# Patient Record
Sex: Male | Born: 1935 | Race: Black or African American | Hispanic: No | Marital: Married | Smoking: Former smoker
Health system: Southern US, Community
[De-identification: ages and names within clinical notes are randomized; demographics above are authoritative.]

## PROBLEM LIST (undated history)

## (undated) DIAGNOSIS — I4891 Unspecified atrial fibrillation: Secondary | ICD-10-CM

## (undated) DIAGNOSIS — F0390 Unspecified dementia without behavioral disturbance: Secondary | ICD-10-CM

## (undated) DIAGNOSIS — R569 Unspecified convulsions: Secondary | ICD-10-CM

## (undated) DIAGNOSIS — E7800 Pure hypercholesterolemia, unspecified: Secondary | ICD-10-CM

## (undated) DIAGNOSIS — I10 Essential (primary) hypertension: Secondary | ICD-10-CM

## (undated) DIAGNOSIS — I639 Cerebral infarction, unspecified: Secondary | ICD-10-CM

## (undated) DIAGNOSIS — R001 Bradycardia, unspecified: Secondary | ICD-10-CM

## (undated) DIAGNOSIS — H8110 Benign paroxysmal vertigo, unspecified ear: Secondary | ICD-10-CM

## (undated) HISTORY — PX: PACEMAKER INSERTION: SHX728

---

## 2015-03-18 ENCOUNTER — Encounter: Payer: Self-pay | Admitting: Podiatry

## 2015-03-18 ENCOUNTER — Ambulatory Visit (INDEPENDENT_AMBULATORY_CARE_PROVIDER_SITE_OTHER): Payer: Medicare PPO | Admitting: Podiatry

## 2015-03-18 VITALS — BP 131/79 | HR 93 | Resp 14

## 2015-03-18 DIAGNOSIS — B351 Tinea unguium: Secondary | ICD-10-CM

## 2015-03-18 DIAGNOSIS — M79676 Pain in unspecified toe(s): Secondary | ICD-10-CM | POA: Diagnosis not present

## 2015-03-18 DIAGNOSIS — L89891 Pressure ulcer of other site, stage 1: Secondary | ICD-10-CM | POA: Diagnosis not present

## 2015-03-18 DIAGNOSIS — L600 Ingrowing nail: Secondary | ICD-10-CM | POA: Diagnosis not present

## 2015-03-18 DIAGNOSIS — L97521 Non-pressure chronic ulcer of other part of left foot limited to breakdown of skin: Secondary | ICD-10-CM

## 2015-03-18 DIAGNOSIS — M79675 Pain in left toe(s): Secondary | ICD-10-CM | POA: Diagnosis not present

## 2015-03-18 NOTE — Progress Notes (Signed)
   Subjective:    Patient ID: Edwin Wilson, male    DOB: 1930/02/19, 81 y.o.   MRN: [id number]  HPI  Patient presents her with right toe that is bleeding, draining and painful. This patient presents to the office with injured big toenail left foot.  He does not remember injuring his toe but for over a week the toe has become painful with drainage from under left big toe. There is malodor coming from under his toenail with white tissue around the borders of his left big toe. He presents for evaluation and treatment. Review of Systems  All other systems reviewed and are negative.      Objective:   Physical Exam GENERAL APPEARANCE: Alert, conversant. Appropriately groomed. No acute distress.  VASCULAR: Pedal pulses are not  palpable at  [facility] and PT bilateral.  Capillary refill time is diminished.,  Cold feet noted. NEUROLOGIC: sensation is normal to 5.07 monofilament at 5/5 sites bilateral.  Light touch is intact bilateral, Muscle strength normal.  MUSCULOSKELETAL: acceptable muscle strength, tone and stability bilateral.  Intrinsic muscluature intact bilateral.  Rectus appearance of foot and digits noted bilateral.   DERMATOLOGIC: skin color, texture, and turgor are within normal limits.  No preulcerative lesions or ulcers  are seen, no interdigital maceration noted.  No open lesions present.   No drainage noted.  NAULS  Thick disfigured discolored nails both feet.  His hallux toenail left foot is unattached from nail bed.  There is drainage and white necrotic tissue under the nail. Malodor noted.         Assessment & Plan:  Ulcer subungually left hallux.  IE  Avulsion nail plate left hallux.  Debride necrotic tissue.  Neosporin/DSD  Home instructions given.RTC  1 week

## 2015-03-27 ENCOUNTER — Ambulatory Visit: Payer: Medicare PPO | Admitting: Podiatry

## 2015-04-17 ENCOUNTER — Ambulatory Visit: Payer: Medicare PPO | Admitting: Podiatry

## 2016-04-20 ENCOUNTER — Emergency Department (HOSPITAL_BASED_OUTPATIENT_CLINIC_OR_DEPARTMENT_OTHER): Payer: Medicare Other

## 2016-04-20 ENCOUNTER — Encounter (HOSPITAL_BASED_OUTPATIENT_CLINIC_OR_DEPARTMENT_OTHER): Payer: Self-pay

## 2016-04-20 ENCOUNTER — Inpatient Hospital Stay (HOSPITAL_BASED_OUTPATIENT_CLINIC_OR_DEPARTMENT_OTHER)
Admission: EM | Admit: 2016-04-20 | Discharge: 2016-04-24 | DRG: 388 | Disposition: A | Discharge status: Home / Self Care | Payer: Medicare Other | Attending: Internal Medicine | Admitting: Internal Medicine

## 2016-04-20 DIAGNOSIS — N179 Acute kidney failure, unspecified: Secondary | ICD-10-CM | POA: Diagnosis present

## 2016-04-20 DIAGNOSIS — R0602 Shortness of breath: Secondary | ICD-10-CM

## 2016-04-20 DIAGNOSIS — Z7902 Long term (current) use of antithrombotics/antiplatelets: Secondary | ICD-10-CM

## 2016-04-20 DIAGNOSIS — D649 Anemia, unspecified: Secondary | ICD-10-CM | POA: Diagnosis not present

## 2016-04-20 DIAGNOSIS — Z950 Presence of cardiac pacemaker: Secondary | ICD-10-CM

## 2016-04-20 DIAGNOSIS — K562 Volvulus: Principal | ICD-10-CM | POA: Diagnosis present

## 2016-04-20 DIAGNOSIS — I10 Essential (primary) hypertension: Secondary | ICD-10-CM

## 2016-04-20 DIAGNOSIS — Z7982 Long term (current) use of aspirin: Secondary | ICD-10-CM

## 2016-04-20 DIAGNOSIS — E785 Hyperlipidemia, unspecified: Secondary | ICD-10-CM | POA: Diagnosis present

## 2016-04-20 DIAGNOSIS — M6281 Muscle weakness (generalized): Secondary | ICD-10-CM

## 2016-04-20 DIAGNOSIS — R140 Abdominal distension (gaseous): Secondary | ICD-10-CM

## 2016-04-20 DIAGNOSIS — Y95 Nosocomial condition: Secondary | ICD-10-CM | POA: Diagnosis present

## 2016-04-20 DIAGNOSIS — R079 Chest pain, unspecified: Secondary | ICD-10-CM

## 2016-04-20 DIAGNOSIS — R739 Hyperglycemia, unspecified: Secondary | ICD-10-CM | POA: Diagnosis present

## 2016-04-20 DIAGNOSIS — F0391 Unspecified dementia with behavioral disturbance: Secondary | ICD-10-CM | POA: Diagnosis present

## 2016-04-20 DIAGNOSIS — R569 Unspecified convulsions: Secondary | ICD-10-CM

## 2016-04-20 DIAGNOSIS — Z8673 Personal history of transient ischemic attack (TIA), and cerebral infarction without residual deficits: Secondary | ICD-10-CM

## 2016-04-20 DIAGNOSIS — Z87891 Personal history of nicotine dependence: Secondary | ICD-10-CM

## 2016-04-20 DIAGNOSIS — H8110 Benign paroxysmal vertigo, unspecified ear: Secondary | ICD-10-CM | POA: Diagnosis present

## 2016-04-20 DIAGNOSIS — J189 Pneumonia, unspecified organism: Secondary | ICD-10-CM | POA: Diagnosis present

## 2016-04-20 DIAGNOSIS — K920 Hematemesis: Secondary | ICD-10-CM | POA: Diagnosis present

## 2016-04-20 DIAGNOSIS — I4891 Unspecified atrial fibrillation: Secondary | ICD-10-CM | POA: Diagnosis present

## 2016-04-20 DIAGNOSIS — I484 Atypical atrial flutter: Secondary | ICD-10-CM | POA: Diagnosis present

## 2016-04-20 DIAGNOSIS — F03918 Unspecified dementia, unspecified severity, with other behavioral disturbance: Secondary | ICD-10-CM

## 2016-04-20 DIAGNOSIS — G40909 Epilepsy, unspecified, not intractable, without status epilepticus: Secondary | ICD-10-CM | POA: Diagnosis present

## 2016-04-20 DIAGNOSIS — Z79899 Other long term (current) drug therapy: Secondary | ICD-10-CM

## 2016-04-20 DIAGNOSIS — R1310 Dysphagia, unspecified: Secondary | ICD-10-CM | POA: Diagnosis present

## 2016-04-20 DIAGNOSIS — I119 Hypertensive heart disease without heart failure: Secondary | ICD-10-CM | POA: Diagnosis present

## 2016-04-20 DIAGNOSIS — K219 Gastro-esophageal reflux disease without esophagitis: Secondary | ICD-10-CM | POA: Diagnosis present

## 2016-04-20 HISTORY — DX: Bradycardia, unspecified: R00.1

## 2016-04-20 HISTORY — DX: Pure hypercholesterolemia, unspecified: E78.00

## 2016-04-20 HISTORY — DX: Cerebral infarction, unspecified: I63.9

## 2016-04-20 HISTORY — DX: Unspecified atrial fibrillation: I48.91

## 2016-04-20 HISTORY — DX: Unspecified convulsions: R56.9

## 2016-04-20 HISTORY — DX: Unspecified dementia, unspecified severity, without behavioral disturbance, psychotic disturbance, mood disturbance, and anxiety: F03.90

## 2016-04-20 HISTORY — DX: Essential (primary) hypertension: I10

## 2016-04-20 HISTORY — DX: Benign paroxysmal vertigo, unspecified ear: H81.10

## 2016-04-20 LAB — URINALYSIS, ROUTINE W REFLEX MICROSCOPIC
Glucose, UA: NEGATIVE mg/dL
KETONES UR: 15 mg/dL — AB
LEUKOCYTES UA: NEGATIVE
NITRITE: NEGATIVE
Specific Gravity, Urine: 1.02 (ref 1.005–1.030)
pH: 5.5 (ref 5.0–8.0)

## 2016-04-20 LAB — COMPREHENSIVE METABOLIC PANEL
ALBUMIN: 4 g/dL (ref 3.5–5.0)
ALT: 25 U/L (ref 17–63)
ANION GAP: 10 (ref 5–15)
AST: 27 U/L (ref 15–41)
Alkaline Phosphatase: 77 U/L (ref 38–126)
BILIRUBIN TOTAL: 0.6 mg/dL (ref 0.3–1.2)
BUN: 20 mg/dL (ref 6–20)
CHLORIDE: 108 mmol/L (ref 101–111)
CO2: 19 mmol/L — ABNORMAL LOW (ref 22–32)
Calcium: 10.5 mg/dL — ABNORMAL HIGH (ref 8.9–10.3)
Creatinine, Ser: 1.52 mg/dL — ABNORMAL HIGH (ref 0.61–1.24)
GFR calc Af Amer: 47 mL/min — ABNORMAL LOW (ref >60)
GFR, EST NON AFRICAN AMERICAN: 41 mL/min — AB (ref >60)
GLUCOSE: 210 mg/dL — AB (ref 65–99)
POTASSIUM: 3.9 mmol/L (ref 3.5–5.1)
Sodium: 137 mmol/L (ref 135–145)
TOTAL PROTEIN: 8.9 g/dL — AB (ref 6.5–8.1)

## 2016-04-20 LAB — CBC WITH DIFFERENTIAL/PLATELET
BASOS PCT: 0 %
Basophils Absolute: 0 10*3/uL (ref 0.0–0.1)
EOS PCT: 0 %
Eosinophils Absolute: 0 10*3/uL (ref 0.0–0.7)
HEMATOCRIT: 41.7 % (ref 39.0–52.0)
Hemoglobin: 14 g/dL (ref 13.0–17.0)
Lymphocytes Relative: 19 %
Lymphs Abs: 1.3 10*3/uL (ref 0.7–4.0)
MCH: 30.8 pg (ref 26.0–34.0)
MCHC: 33.6 g/dL (ref 30.0–36.0)
MCV: 91.9 fL (ref 78.0–100.0)
MONO ABS: 0.3 10*3/uL (ref 0.1–1.0)
MONOS PCT: 4 %
NEUTROS ABS: 5.4 10*3/uL (ref 1.7–7.7)
Neutrophils Relative %: 77 %
PLATELETS: 212 10*3/uL (ref 150–400)
RBC: 4.54 MIL/uL (ref 4.22–5.81)
RDW: 14.6 % (ref 11.5–15.5)
WBC: 7 10*3/uL (ref 4.0–10.5)

## 2016-04-20 LAB — URINE MICROSCOPIC-ADD ON

## 2016-04-20 LAB — CBG MONITORING, ED: GLUCOSE-CAPILLARY: 196 mg/dL — AB (ref 65–99)

## 2016-04-20 LAB — TROPONIN I

## 2016-04-20 MED ORDER — IPRATROPIUM-ALBUTEROL 0.5-2.5 (3) MG/3ML IN SOLN
3.0000 mL | Freq: Once | RESPIRATORY_TRACT | Status: AC
  Administered 2016-04-20: 3 mL via RESPIRATORY_TRACT
  Filled 2016-04-20: qty 3

## 2016-04-20 MED ORDER — SODIUM CHLORIDE 0.9 % IV BOLUS (SEPSIS)
500.0000 mL | Freq: Once | INTRAVENOUS | Status: AC
  Administered 2016-04-21: 500 mL via INTRAVENOUS

## 2016-04-20 MED ORDER — ALBUTEROL SULFATE (2.5 MG/3ML) 0.083% IN NEBU
2.5000 mg | INHALATION_SOLUTION | Freq: Once | RESPIRATORY_TRACT | Status: AC
  Administered 2016-04-20: 2.5 mg via RESPIRATORY_TRACT
  Filled 2016-04-20: qty 3

## 2016-04-20 NOTE — ED Notes (Signed)
EMT called RN to room due to pt "sounding wet with his breathing". RN assessed lung sounds, crackles heard. RT asked to assess, and EDP notified of change.

## 2016-04-20 NOTE — ED Provider Notes (Signed)
[facility] Provider Note   CSN: [id number] Arrival date & time: 04/20/16  2047   By signing my name below, I, Teofilo Pod, attest that this documentation has been prepared under the direction and in the presence of Rolan Bucco, MD . Electronically Signed: Teofilo Pod, ED Scribe. 04/20/2016. 9:34 PM.   History   Chief Complaint Chief Complaint  Patient presents with  . Shortness of Breath    The history is provided by the patient. No language interpreter was used.   HPI Comments:  Edwin Wilson is a 82 y.o. male with PMHx of dementia who presents to the Emergency Department complaining of constant SOB and intermittent chest pain that began this morning. Per wife, pt began breaking out in sweats, breathing heavily, and began complaining of abdominal pain earlier today. Wife states that pt has had a cough with clear sputum and did not eat much at dinner. Pt reports some mild chest pain, but denies any current SOB or pain elsewhere. Wife states that pt is currently baseline, and of his normal mental status. Pt was living at assisted living and was brought home last week. Pt has not had a BM today. Pt denies fever, vomiting, diarrhea, urinary symptoms, gain problem, and other associated complaints.    Past Medical History:  Diagnosis Date  . Dementia   . High cholesterol   . Hypertension   . Stroke [facility])     Patient Active Problem List   Diagnosis Date Noted  . Chest pain 04/21/2016    Past Surgical History:  Procedure Laterality Date  . PACEMAKER INSERTION         Home Medications    Prior to Admission medications   Medication Sig Start Date End Date Taking? Authorizing Provider  amLODipine (NORVASC) 10 MG tablet Take 10 mg by mouth. 10/26/14 04/20/16 Yes Historical Provider, MD  aspirin EC 81 MG tablet Take 81 mg by mouth. 12/30/09  Yes Historical Provider, MD  atorvastatin (LIPITOR) 80 MG tablet Take 80 mg by mouth. 10/26/14 04/20/16 Yes  Historical Provider, MD  donepezil (ARICEPT) 10 MG tablet Take 10 mg by mouth. 11/14/14  Yes Historical Provider, MD  dorzolamide-timolol (COSOPT) 22.3-6.8 MG/ML ophthalmic solution Place 1 drop into both eyes 2 (two) times daily.   Yes Historical Provider, MD  latanoprost (XALATAN) 0.005 % ophthalmic solution 1 drop at bedtime.   Yes Historical Provider, MD  levETIRAcetam (KEPPRA) 500 MG tablet Take 500 mg by mouth. 11/14/14  Yes Historical Provider, MD  lisinopril (PRINIVIL,ZESTRIL) 30 MG tablet Take 40 mg by mouth.  10/26/14 04/20/16 Yes Historical Provider, MD  Magnesium Oxide 250 MG TABS TAKE 1 TABLET BY MOUTH DAILY AS DIRECTED 05/18/14  Yes Historical Provider, MD  Multiple Vitamin (MULTI-VITAMINS) TABS Take by mouth.   Yes Historical Provider, MD  Omega-3 1000 MG CAPS Take by mouth.   Yes Historical Provider, MD  potassium chloride SA (K-DUR,KLOR-CON) 20 MEQ tablet TAKE 2 TABLETS BY MOUTH TWICE DAILY WITH MEALS 11/22/14  Yes Historical Provider, MD  ranitidine (ZANTAC) 150 MG tablet Take 150 mg by mouth. 10/26/14 04/20/16 Yes Historical Provider, MD  bimatoprost (LUMIGAN) 0.01 % SOLN Administer 1 drop to both eyes nightly.  10/29/10   Historical Provider, MD  brimonidine-timolol (COMBIGAN) 0.2-0.5 % ophthalmic solution Administer 1 drop to both eyes every twelve (12) hours. Frequency:   Dosage:0.0     Instructions:  Note: 07/29/10   Historical Provider, MD  diltiazem (CARTIA XT) 120 MG 24 hr capsule TAKE  ONE CAPSULE BY MOUTH EVERY MORNING 05/28/14   Historical Provider, MD  haloperidol (HALDOL) 5 MG tablet 1/2 to 1 at bedtime as needed 11/14/14   Historical Provider, MD  Incontinence Supply Disposable ([employer] UNDERGARMENT X-ABSORB BELT) MISC Frequency:   Dosage:0     Instructions:  Note:Use as directed for urinary incontinence. 04/26/13   Historical Provider, MD  meclizine (ANTIVERT) 12.5 MG tablet Frequency:   Dosage:0   MG  Instructions:  Note:TAKE 1 TABLET BY MOUTH THREE TIMES DAILY AS NEEDED FOR VERTIGO  04/13/13   Historical Provider, MD  THIAMINE HCL PO Take by mouth. Take as prescribed by Ophthalmologist    Historical Provider, MD    Family History No family history on file.  Social History Social History  Substance Use Topics  . Smoking status: Former Games developermoker  . Smokeless tobacco: Never Used  . Alcohol use No     Allergies   Review of patient's allergies indicates no known allergies.   Review of Systems Review of Systems  Unable to perform ROS: Dementia     Physical Exam Updated Vital Signs BP (!) 162/105   Pulse 99   Temp (!) 96.2 F (35.7 C) (Rectal) Comment: Nurse and MD notified.  Resp 18   Ht 6\' 2"  (1.88 m)   Wt 158 lb (71.7 kg)   SpO2 93%   BMI 20.29 kg/m   Physical Exam  Constitutional: He is oriented to person, place, and time. He appears well-developed and well-nourished.  HENT:  Head: Normocephalic and atraumatic.  Eyes: Pupils are equal, round, and reactive to light.  Neck: Normal range of motion. Neck supple.  Cardiovascular: Regular rhythm and normal heart sounds.   Tachycardia  Pulmonary/Chest: Effort normal and breath sounds normal. No respiratory distress. He has no wheezes. He has no rales. He exhibits no tenderness.  Abdominal: Soft. Bowel sounds are normal. There is no tenderness. There is no rebound and no guarding.  Musculoskeletal: Normal range of motion. He exhibits no edema.  Lymphadenopathy:    He has no cervical adenopathy.  Neurological: He is alert and oriented to person, place, and time.  Skin: Skin is warm and dry. No rash noted.  Psychiatric: He has a normal mood and affect.     ED Treatments / Results  DIAGNOSTIC STUDIES:  Oxygen Saturation is 100% on RA, normal by my interpretation.    COORDINATION OF CARE:  9:34 PM Discussed treatment plan with pt at bedside and pt agreed to plan.   Labs (all labs ordered are listed, but only abnormal results are displayed) Labs Reviewed  COMPREHENSIVE METABOLIC PANEL -  Abnormal; Notable for the following:       Result Value   CO2 19 (*)    Glucose, Bld 210 (*)    Creatinine, Ser 1.52 (*)    Calcium 10.5 (*)    Total Protein 8.9 (*)    GFR calc non Af Amer 41 (*)    GFR calc Af Amer 47 (*)    All other components within normal limits  URINALYSIS, ROUTINE W REFLEX MICROSCOPIC (NOT AT [facility]) - Abnormal; Notable for the following:    APPearance CLOUDY (*)    Hgb urine dipstick SMALL (*)    Bilirubin Urine SMALL (*)    Ketones, ur 15 (*)    Protein, ur >300 (*)    All other components within normal limits  URINE MICROSCOPIC-ADD ON - Abnormal; Notable for the following:    Squamous Epithelial / LPF 0-5 (*)  Bacteria, UA RARE (*)    Casts HYALINE CASTS (*)    All other components within normal limits  CBG MONITORING, ED - Abnormal; Notable for the following:    Glucose-Capillary 196 (*)    All other components within normal limits  CBC WITH DIFFERENTIAL/PLATELET  TROPONIN I  CBG MONITORING, ED    EKG  EKG Interpretation  Date/Time:  Monday April 20 2016 22:37:33 EDT Ventricular Rate:  88 PR Interval:    QRS Duration: 97 QT Interval:  386 QTC Calculation: 467 R Axis:   -64 Text Interpretation:  Atrial fibrillation Left anterior fascicular block Consider right ventricular hypertrophy Abnrm T, probable ischemia, anterolateral lds Confirmed by Maveryk Renstrom  MD, Brenda Cowher ([id number]) on 04/21/2016 12:00:53 AM       Radiology Ct Head Wo Contrast  Result Date: 04/20/2016 CLINICAL DATA:  Altered mental status. Dementia. Chest pain, cough, and shortness of breath. EXAM: CT HEAD WITHOUT CONTRAST TECHNIQUE: Contiguous axial images were obtained from the base of the skull through the vertex without intravenous contrast. COMPARISON:  None. FINDINGS: Brain: Prominent diffuse cerebral atrophy. Ventricular dilatation consistent with central atrophy. Low-attenuation changes in the deep white matter consistent small vessel ischemia. No mass effect or midline shift. No  abnormal extra-axial fluid collections. Gray-white matter junctions are distinct. Basal cisterns are not effaced. No evidence of acute intracranial hemorrhage. Vascular: Atherosclerotic vascular calcifications are present. Skull: Normal. Negative for fracture or focal lesion. Sinuses/Orbits: There is an air-fluid level in the right maxillary antrum. This may indicate sinusitis. Paranasal sinuses and mastoid air cells are otherwise clear. Other: None. IMPRESSION: No acute intracranial abnormalities. Prominent cerebral atrophy and small vessel ischemic changes. Air-fluid level in the right maxillary antrum may indicate sinus infection. Electronically Signed   By: Burman Nieves M.D.   On: 04/20/2016 23:06   Dg Chest Portable 1 View  Result Date: 04/20/2016 CLINICAL DATA:  Dyspnea since this evening. History of dementia, infarct, hypertension and pacemaker all. EXAM: PORTABLE CHEST 1 VIEW COMPARISON:  12/25/2015. FINDINGS: Cardiomegaly with tortuous ectatic appearing thoracic aorta. Right ventricular pacing wire is noted with left-sided pacemaker apparatus. Skin fold artifact along the periphery of the right hemithorax. No pneumonic consolidation, CHF, pneumothorax nor effusion. Moderate colonic distention below the diaphragm up to 8 cm possibly representing pseudo-obstruction. No suspicious osseous abnormalities. IMPRESSION: No acute cardiopulmonary disease. Cardiomegaly. Skin fold artifact along the periphery of the right thorax. Electronically Signed   By: Tollie Eth M.D.   On: 04/20/2016 22:07    Procedures Procedures (including critical care time)  Medications Ordered in ED Medications  sodium chloride 0.9 % bolus 500 mL (not administered)  ipratropium-albuterol (DUONEB) 0.5-2.5 (3) MG/3ML nebulizer solution 3 mL (3 mLs Nebulization Given 04/20/16 2240)  albuterol (PROVENTIL) (2.5 MG/3ML) 0.083% nebulizer solution 2.5 mg (2.5 mg Nebulization Given 04/20/16 2240)     Initial Impression /  Assessment and Plan / ED Course  I have reviewed the triage vital signs and the nursing notes.  Pertinent labs & imaging results that were available during my care of the patient were reviewed by me and considered in my medical decision making (see chart for details).  Clinical Course    Patient has a history of dementia so his history is limited. He has been complaining of intermittent chest pain throughout the day and his wife states he's been more short of breath. He's had a cough as well. There is no evidence of pneumonia. His EKG doesn't show ischemic changes but he is in atrial  fibrillation. Initially he was tachycardic but his heart rate has improved into the 80s. His troponin is negative. I reviewed his records in care everywhere. I don't see a definite history of atrial fibrillation in the past. However it doesn't like he was on Cardizem at one point. His chest x-ray is clear. He did begin to have some slight wheezing in the ED and was given a nebulizer treatment which did improve his wheezing. His wife felt that his breathing was better. He's had several episodes where he'll become very diaphoretic in the ED. He doesn't have any focal neurologic deficits. His head CT is negative. There is no evidence of infection. He's afebrile. His urinalysis is negative for infection. He does have a mild elevation in his creatinine. On review of his records from [facility], his creatinine has been normal in the past. He was given IV fluids in the ED. We will also will interrogate his pacemaker. Given his ongoing complaints of chest pain or shortness of breath and his episodes of diaphoresis. No witnessed in the ED, I feel that he should be admitted for observation and serial troponins. I will consult the hospitalist for transfer.  I spoke with Dr. Clyde Lundborg who has accepted the pt for transfer to obs/tele Final Clinical Impressions(s) / ED Diagnoses   Final diagnoses:  Chest pain in adult  SOB  (shortness of breath)    New Prescriptions New Prescriptions   No medications on file  I personally performed the services described in this documentation, which was scribed in my presence.  The recorded information has been reviewed and considered.     Rolan Bucco, MD 04/21/16 [phone]

## 2016-04-20 NOTE — ED Triage Notes (Addendum)
Per wife pt with SOB, sweats started this pm-pt with hx of dementia-when asked if in pain pt states "no"-NAD-steady gait

## 2016-04-21 ENCOUNTER — Observation Stay (HOSPITAL_COMMUNITY): Payer: Medicare Other

## 2016-04-21 ENCOUNTER — Encounter (HOSPITAL_COMMUNITY): Payer: Self-pay | Admitting: Family Medicine

## 2016-04-21 ENCOUNTER — Encounter (HOSPITAL_COMMUNITY)
Admission: EM | Disposition: A | Discharge status: Home / Self Care | Payer: Self-pay | Source: Home / Self Care | Attending: Internal Medicine

## 2016-04-21 DIAGNOSIS — R140 Abdominal distension (gaseous): Secondary | ICD-10-CM

## 2016-04-21 DIAGNOSIS — R079 Chest pain, unspecified: Secondary | ICD-10-CM | POA: Diagnosis present

## 2016-04-21 DIAGNOSIS — R569 Unspecified convulsions: Secondary | ICD-10-CM

## 2016-04-21 DIAGNOSIS — I4892 Unspecified atrial flutter: Secondary | ICD-10-CM | POA: Diagnosis not present

## 2016-04-21 DIAGNOSIS — H8110 Benign paroxysmal vertigo, unspecified ear: Secondary | ICD-10-CM | POA: Diagnosis present

## 2016-04-21 DIAGNOSIS — K920 Hematemesis: Secondary | ICD-10-CM | POA: Diagnosis present

## 2016-04-21 DIAGNOSIS — D649 Anemia, unspecified: Secondary | ICD-10-CM | POA: Diagnosis not present

## 2016-04-21 DIAGNOSIS — G40909 Epilepsy, unspecified, not intractable, without status epilepticus: Secondary | ICD-10-CM | POA: Diagnosis present

## 2016-04-21 DIAGNOSIS — F0391 Unspecified dementia with behavioral disturbance: Secondary | ICD-10-CM

## 2016-04-21 DIAGNOSIS — Z79899 Other long term (current) drug therapy: Secondary | ICD-10-CM | POA: Diagnosis not present

## 2016-04-21 DIAGNOSIS — K562 Volvulus: Secondary | ICD-10-CM | POA: Diagnosis present

## 2016-04-21 DIAGNOSIS — E785 Hyperlipidemia, unspecified: Secondary | ICD-10-CM | POA: Diagnosis present

## 2016-04-21 DIAGNOSIS — I484 Atypical atrial flutter: Secondary | ICD-10-CM | POA: Diagnosis present

## 2016-04-21 DIAGNOSIS — R1310 Dysphagia, unspecified: Secondary | ICD-10-CM | POA: Diagnosis present

## 2016-04-21 DIAGNOSIS — J189 Pneumonia, unspecified organism: Secondary | ICD-10-CM | POA: Diagnosis present

## 2016-04-21 DIAGNOSIS — N179 Acute kidney failure, unspecified: Secondary | ICD-10-CM | POA: Diagnosis present

## 2016-04-21 DIAGNOSIS — R0602 Shortness of breath: Secondary | ICD-10-CM

## 2016-04-21 DIAGNOSIS — Z8673 Personal history of transient ischemic attack (TIA), and cerebral infarction without residual deficits: Secondary | ICD-10-CM | POA: Diagnosis not present

## 2016-04-21 DIAGNOSIS — G308 Other Alzheimer's disease: Secondary | ICD-10-CM | POA: Diagnosis not present

## 2016-04-21 DIAGNOSIS — F03918 Unspecified dementia, unspecified severity, with other behavioral disturbance: Secondary | ICD-10-CM

## 2016-04-21 DIAGNOSIS — I10 Essential (primary) hypertension: Secondary | ICD-10-CM

## 2016-04-21 DIAGNOSIS — I119 Hypertensive heart disease without heart failure: Secondary | ICD-10-CM | POA: Diagnosis present

## 2016-04-21 DIAGNOSIS — Z7982 Long term (current) use of aspirin: Secondary | ICD-10-CM | POA: Diagnosis not present

## 2016-04-21 DIAGNOSIS — Y95 Nosocomial condition: Secondary | ICD-10-CM | POA: Diagnosis present

## 2016-04-21 DIAGNOSIS — Z950 Presence of cardiac pacemaker: Secondary | ICD-10-CM | POA: Diagnosis not present

## 2016-04-21 DIAGNOSIS — Z7902 Long term (current) use of antithrombotics/antiplatelets: Secondary | ICD-10-CM | POA: Diagnosis not present

## 2016-04-21 DIAGNOSIS — Z87891 Personal history of nicotine dependence: Secondary | ICD-10-CM | POA: Diagnosis not present

## 2016-04-21 DIAGNOSIS — I4891 Unspecified atrial fibrillation: Secondary | ICD-10-CM | POA: Diagnosis present

## 2016-04-21 DIAGNOSIS — R739 Hyperglycemia, unspecified: Secondary | ICD-10-CM | POA: Diagnosis present

## 2016-04-21 DIAGNOSIS — K219 Gastro-esophageal reflux disease without esophagitis: Secondary | ICD-10-CM | POA: Diagnosis present

## 2016-04-21 HISTORY — PX: FLEXIBLE SIGMOIDOSCOPY: SHX5431

## 2016-04-21 LAB — CBC
HCT: 45.2 % (ref 39.0–52.0)
HCT: 45.5 % (ref 39.0–52.0)
HEMATOCRIT: 47.3 % (ref 39.0–52.0)
HEMOGLOBIN: 15.8 g/dL (ref 13.0–17.0)
Hemoglobin: 14.7 g/dL (ref 13.0–17.0)
Hemoglobin: 15.1 g/dL (ref 13.0–17.0)
MCH: 30.2 pg (ref 26.0–34.0)
MCH: 30.7 pg (ref 26.0–34.0)
MCH: 30.7 pg (ref 26.0–34.0)
MCHC: 32.5 g/dL (ref 30.0–36.0)
MCHC: 33.4 g/dL (ref 30.0–36.0)
MCHC: 33.2 g/dL (ref 30.0–36.0)
MCV: 92.8 fL (ref 78.0–100.0)
MCV: 91.8 fL (ref 78.0–100.0)
MCV: 92.5 fL (ref 78.0–100.0)
PLATELETS: 211 10*3/uL (ref 150–400)
Platelets: 198 10*3/uL (ref 150–400)
Platelets: 184 K/uL (ref 150–400)
RBC: 4.87 MIL/uL (ref 4.22–5.81)
RBC: 5.15 MIL/uL (ref 4.22–5.81)
RBC: 4.92 MIL/uL (ref 4.22–5.81)
RDW: 14.6 % (ref 11.5–15.5)
RDW: 14.6 % (ref 11.5–15.5)
RDW: 14.6 % (ref 11.5–15.5)
WBC: 6.4 10*3/uL (ref 4.0–10.5)
WBC: 6.6 10*3/uL (ref 4.0–10.5)
WBC: 9.3 K/uL (ref 4.0–10.5)

## 2016-04-21 LAB — TYPE AND SCREEN
ABO/RH(D): A POS
ANTIBODY SCREEN: NEGATIVE
DAT, IgG: NEGATIVE

## 2016-04-21 LAB — TROPONIN I
Troponin I: <0.03 ng/mL
Troponin I: <0.03 ng/mL (ref <0.03)

## 2016-04-21 LAB — T4, FREE: FREE T4: 1.48 ng/dL — AB (ref 0.61–1.12)

## 2016-04-21 LAB — MAGNESIUM: Magnesium: 1.5 mg/dL — ABNORMAL LOW (ref 1.7–2.4)

## 2016-04-21 LAB — PHOSPHORUS: PHOSPHORUS: 2.8 mg/dL (ref 2.5–4.6)

## 2016-04-21 LAB — GLUCOSE, CAPILLARY
GLUCOSE-CAPILLARY: 118 mg/dL — AB (ref 65–99)
Glucose-Capillary: 109 mg/dL — ABNORMAL HIGH (ref 65–99)
Glucose-Capillary: 88 mg/dL (ref 65–99)

## 2016-04-21 LAB — APTT
APTT: 33 s (ref 24–36)
aPTT: 33 s (ref 24–36)

## 2016-04-21 LAB — LACTIC ACID, PLASMA: Lactic Acid, Venous: 3.6 mmol/L (ref 0.5–1.9)

## 2016-04-21 LAB — BRAIN NATRIURETIC PEPTIDE: B Natriuretic Peptide: 240.9 pg/mL — ABNORMAL HIGH (ref 0.0–100.0)

## 2016-04-21 LAB — PROTIME-INR
INR: 1.28
INR: 1.34
Prothrombin Time: 16.1 seconds — ABNORMAL HIGH (ref 11.4–15.2)
Prothrombin Time: 16.7 s — ABNORMAL HIGH (ref 11.4–15.2)

## 2016-04-21 LAB — TSH: TSH: 1.491 u[IU]/mL (ref 0.350–4.500)

## 2016-04-21 SURGERY — SIGMOIDOSCOPY, FLEXIBLE
Anesthesia: Moderate Sedation

## 2016-04-21 MED ORDER — SODIUM CHLORIDE 0.9% FLUSH: 3.0000 mL | Freq: Two times a day (BID) | INTRAVENOUS | Status: DC

## 2016-04-21 MED ORDER — MIDAZOLAM HCL 10 MG/2ML IJ SOLN
INTRAMUSCULAR | Status: DC | PRN
  Administered 2016-04-21: 1 mg via INTRAVENOUS

## 2016-04-21 MED ORDER — ATORVASTATIN CALCIUM 80 MG PO TABS
80.0000 mg | ORAL_TABLET | Freq: Every day | ORAL | Status: DC
  Administered 2016-04-22 – 2016-04-24 (×3): 80 mg via ORAL
  Filled 2016-04-21 (×3): qty 1

## 2016-04-21 MED ORDER — DORZOLAMIDE HCL-TIMOLOL MAL 2-0.5 % OP SOLN: 1.0000 [drp] | Freq: Two times a day (BID) | OPHTHALMIC | Status: DC

## 2016-04-21 MED ORDER — FENTANYL CITRATE (PF) 100 MCG/2ML IJ SOLN
INTRAMUSCULAR | Status: DC | PRN
  Administered 2016-04-21: 25 ug via INTRAVENOUS

## 2016-04-21 MED ORDER — HEPARIN SODIUM (PORCINE) 5000 UNIT/ML IJ SOLN: 5000.0000 [IU] | Freq: Three times a day (TID) | INTRAMUSCULAR | Status: DC

## 2016-04-21 MED ORDER — CEFEPIME HCL 1 G IJ SOLR
1.0000 g | INTRAMUSCULAR | Status: DC
  Administered 2016-04-21 – 2016-04-22 (×2): 1 g via INTRAVENOUS
  Filled 2016-04-21 (×3): qty 1

## 2016-04-21 MED ORDER — LISINOPRIL 40 MG PO TABS
40.0000 mg | ORAL_TABLET | Freq: Every day | ORAL | Status: DC
  Administered 2016-04-22 – 2016-04-24 (×3): 40 mg via ORAL
  Filled 2016-04-21 (×3): qty 1

## 2016-04-21 MED ORDER — HALOPERIDOL 5 MG PO TABS
5.0000 mg | ORAL_TABLET | Freq: Every evening | ORAL | Status: DC | PRN
  Administered 2016-04-21: 5 mg via ORAL
  Filled 2016-04-21 (×3): qty 1

## 2016-04-21 MED ORDER — MORPHINE SULFATE (PF) 2 MG/ML IV SOLN: 2.0000 mg | INTRAVENOUS | Status: DC | PRN

## 2016-04-21 MED ORDER — MIDAZOLAM HCL 5 MG/ML IJ SOLN
INTRAMUSCULAR | Status: AC
  Filled 2016-04-21: qty 1

## 2016-04-21 MED ORDER — MECLIZINE HCL 12.5 MG PO TABS: 12.5000 mg | ORAL_TABLET | Freq: Two times a day (BID) | ORAL | Status: DC | PRN

## 2016-04-21 MED ORDER — IOPAMIDOL (ISOVUE-370) INJECTION 76%
INTRAVENOUS | Status: AC
  Administered 2016-04-21: 80 mL
  Filled 2016-04-21: qty 100

## 2016-04-21 MED ORDER — SODIUM CHLORIDE 0.9 % IV SOLN
1250.0000 mg | Freq: Once | INTRAVENOUS | Status: AC
  Administered 2016-04-21: 1250 mg via INTRAVENOUS
  Filled 2016-04-21: qty 1250

## 2016-04-21 MED ORDER — BRIMONIDINE TARTRATE-TIMOLOL 0.2-0.5 % OP SOLN
1.0000 [drp] | Freq: Two times a day (BID) | OPHTHALMIC | Status: DC
  Filled 2016-04-21: qty 10

## 2016-04-21 MED ORDER — VANCOMYCIN HCL IN DEXTROSE 750-5 MG/150ML-% IV SOLN
750.0000 mg | Freq: Two times a day (BID) | INTRAVENOUS | Status: DC
  Administered 2016-04-22 – 2016-04-23 (×3): 750 mg via INTRAVENOUS
  Filled 2016-04-21 (×5): qty 150

## 2016-04-21 MED ORDER — INSULIN ASPART 100 UNIT/ML ~~LOC~~ SOLN: 0.0000 [IU] | Freq: Three times a day (TID) | SUBCUTANEOUS | Status: DC

## 2016-04-21 MED ORDER — CLOPIDOGREL BISULFATE 75 MG PO TABS
75.0000 mg | ORAL_TABLET | Freq: Every day | ORAL | Status: DC
  Administered 2016-04-22: 75 mg via ORAL
  Filled 2016-04-21 (×2): qty 1

## 2016-04-21 MED ORDER — NITROGLYCERIN 0.4 MG SL SUBL: 0.4000 mg | SUBLINGUAL_TABLET | SUBLINGUAL | Status: DC | PRN

## 2016-04-21 MED ORDER — ASPIRIN EC 81 MG PO TBEC
81.0000 mg | DELAYED_RELEASE_TABLET | Freq: Every day | ORAL | Status: DC
  Administered 2016-04-22 – 2016-04-23 (×2): 81 mg via ORAL
  Filled 2016-04-21 (×2): qty 1

## 2016-04-21 MED ORDER — CEFEPIME HCL 1 G IJ SOLR
1.0000 g | Freq: Three times a day (TID) | INTRAMUSCULAR | Status: DC
  Filled 2016-04-21 (×2): qty 1

## 2016-04-21 MED ORDER — LEVETIRACETAM 500 MG PO TABS
500.0000 mg | ORAL_TABLET | Freq: Two times a day (BID) | ORAL | Status: DC
  Administered 2016-04-21 – 2016-04-23 (×4): 500 mg via ORAL
  Filled 2016-04-21 (×4): qty 1

## 2016-04-21 MED ORDER — ALPRAZOLAM 0.25 MG PO TABS
0.2500 mg | ORAL_TABLET | Freq: Two times a day (BID) | ORAL | Status: DC | PRN
  Administered 2016-04-22 (×2): 0.25 mg via ORAL
  Filled 2016-04-21 (×2): qty 1

## 2016-04-21 MED ORDER — ACETAMINOPHEN 650 MG RE SUPP: 650.0000 mg | Freq: Four times a day (QID) | RECTAL | Status: DC | PRN

## 2016-04-21 MED ORDER — LATANOPROST 0.005 % OP SOLN: 1.0000 [drp] | Freq: Every day | OPHTHALMIC | Status: DC

## 2016-04-21 MED ORDER — GI COCKTAIL ~~LOC~~: 30.0000 mL | Freq: Four times a day (QID) | ORAL | Status: DC | PRN

## 2016-04-21 MED ORDER — HEPARIN (PORCINE) IN NACL 100-0.45 UNIT/ML-% IJ SOLN: 900.0000 [IU]/h | INTRAMUSCULAR | Status: DC

## 2016-04-21 MED ORDER — SODIUM CHLORIDE 0.9 % IV SOLN: INTRAVENOUS | Status: DC

## 2016-04-21 MED ORDER — ONDANSETRON HCL 4 MG/2ML IJ SOLN: 4.0000 mg | Freq: Four times a day (QID) | INTRAMUSCULAR | Status: DC | PRN

## 2016-04-21 MED ORDER — DORZOLAMIDE HCL-TIMOLOL MAL 2-0.5 % OP SOLN
1.0000 [drp] | Freq: Every day | OPHTHALMIC | Status: DC
  Filled 2016-04-21: qty 10

## 2016-04-21 MED ORDER — DILTIAZEM HCL ER COATED BEADS 120 MG PO CP24: 120.0000 mg | ORAL_CAPSULE | Freq: Every morning | ORAL | Status: DC

## 2016-04-21 MED ORDER — ACETAMINOPHEN 325 MG PO TABS: 650.0000 mg | ORAL_TABLET | Freq: Four times a day (QID) | ORAL | Status: DC | PRN

## 2016-04-21 MED ORDER — SODIUM CHLORIDE 0.9 % IV SOLN
INTRAVENOUS | Status: DC
  Administered 2016-04-21 – 2016-04-23 (×3): via INTRAVENOUS

## 2016-04-21 MED ORDER — TIMOLOL MALEATE 0.5 % OP SOLN
1.0000 [drp] | Freq: Two times a day (BID) | OPHTHALMIC | Status: DC
  Administered 2016-04-21 – 2016-04-24 (×5): 1 [drp] via OPHTHALMIC
  Filled 2016-04-21: qty 5

## 2016-04-21 MED ORDER — BIMATOPROST 0.01 % OP SOLN
1.0000 [drp] | Freq: Every day | OPHTHALMIC | Status: DC
  Administered 2016-04-21 – 2016-04-23 (×3): 1 [drp] via OPHTHALMIC
  Filled 2016-04-21 (×2): qty 2.5

## 2016-04-21 MED ORDER — DORZOLAMIDE HCL-TIMOLOL MAL 2-0.5 % OP SOLN
1.0000 [drp] | Freq: Two times a day (BID) | OPHTHALMIC | Status: DC
  Administered 2016-04-21 – 2016-04-24 (×4): 1 [drp] via OPHTHALMIC
  Filled 2016-04-21 (×2): qty 10

## 2016-04-21 MED ORDER — SODIUM CHLORIDE 0.9 % IV SOLN
8.0000 mg/h | INTRAVENOUS | Status: DC
  Administered 2016-04-21 – 2016-04-22 (×3): 8 mg/h via INTRAVENOUS
  Filled 2016-04-21 (×7): qty 80

## 2016-04-21 MED ORDER — ASPIRIN 325 MG PO TABS
325.0000 mg | ORAL_TABLET | Freq: Every day | ORAL | Status: DC
  Filled 2016-04-21: qty 1

## 2016-04-21 MED ORDER — INSULIN ASPART 100 UNIT/ML ~~LOC~~ SOLN: 0.0000 [IU] | Freq: Every day | SUBCUTANEOUS | Status: DC

## 2016-04-21 MED ORDER — BRIMONIDINE TARTRATE-TIMOLOL 0.2-0.5 % OP SOLN: 1.0000 [drp] | Freq: Two times a day (BID) | OPHTHALMIC | Status: DC

## 2016-04-21 MED ORDER — POTASSIUM CHLORIDE CRYS ER 20 MEQ PO TBCR
40.0000 meq | EXTENDED_RELEASE_TABLET | Freq: Two times a day (BID) | ORAL | Status: DC
  Administered 2016-04-22 – 2016-04-24 (×5): 40 meq via ORAL
  Filled 2016-04-21 (×5): qty 2

## 2016-04-21 MED ORDER — AMLODIPINE BESYLATE 10 MG PO TABS
10.0000 mg | ORAL_TABLET | Freq: Every day | ORAL | Status: DC
  Administered 2016-04-22 – 2016-04-24 (×3): 10 mg via ORAL
  Filled 2016-04-21 (×3): qty 1

## 2016-04-21 MED ORDER — ALBUTEROL SULFATE (2.5 MG/3ML) 0.083% IN NEBU: 2.5000 mg | INHALATION_SOLUTION | RESPIRATORY_TRACT | Status: DC | PRN

## 2016-04-21 MED ORDER — FENTANYL CITRATE (PF) 100 MCG/2ML IJ SOLN
INTRAMUSCULAR | Status: AC
  Filled 2016-04-21: qty 2

## 2016-04-21 MED ORDER — LATANOPROST 0.005 % OP SOLN
1.0000 [drp] | Freq: Every day | OPHTHALMIC | Status: DC
  Administered 2016-04-21 – 2016-04-22 (×2): 1 [drp] via OPHTHALMIC
  Filled 2016-04-21 (×3): qty 2.5

## 2016-04-21 MED ORDER — BRIMONIDINE TARTRATE 0.2 % OP SOLN
1.0000 [drp] | Freq: Two times a day (BID) | OPHTHALMIC | Status: DC
  Administered 2016-04-21 – 2016-04-24 (×5): 1 [drp] via OPHTHALMIC
  Filled 2016-04-21 (×2): qty 5

## 2016-04-21 MED ORDER — FAMOTIDINE 20 MG PO TABS: 20.0000 mg | ORAL_TABLET | Freq: Every day | ORAL | Status: DC

## 2016-04-21 MED ORDER — SODIUM CHLORIDE 0.9% FLUSH: 3.0000 mL | INTRAVENOUS | Status: DC | PRN

## 2016-04-21 MED ORDER — ONDANSETRON HCL 4 MG PO TABS: 4.0000 mg | ORAL_TABLET | Freq: Four times a day (QID) | ORAL | Status: DC | PRN

## 2016-04-21 MED ORDER — SODIUM CHLORIDE 0.9 % IV SOLN: 250.0000 mL | INTRAVENOUS | Status: DC | PRN

## 2016-04-21 MED ORDER — DONEPEZIL HCL 10 MG PO TABS
10.0000 mg | ORAL_TABLET | Freq: Every day | ORAL | Status: DC
  Administered 2016-04-21 – 2016-04-23 (×3): 10 mg via ORAL
  Filled 2016-04-21 (×3): qty 1

## 2016-04-21 NOTE — Consult Note (Signed)
[facility] Surgery Consult Note  Edwin Wilson [birth year]  [id number].    Requesting MD: Linna Darner Chief Complaint/Reason for Consult: sigmoid volvulus  HPI:  Edwin Wilson is an 82yo male with a history of stroke, pacemaker placement, HTN, HLD, and dementia who presented to [facility] 04/20/16 with altered mental status, shortness of breath and chest pain. Patient sleeping therefore history obtained from patient's wife. States that about 1 week ago she removed him from an assisted living facility to return home to live with her. They were concerned that with his degree of dementia she would not be able to care for him at home alone. States that he had not been feeling like himself for about 2 days. Because of his dementia she states that it is always hard to know exactly what is wrong. He had a productive cough, diaphoresis, abdominal pain, and decreased appetite.  No known fevers. Denies emesis, diarrhea, and dysuria. States that he had not had a BM in 2 days.  CT scan showed bilateral PNA, probable sigmoid volvulus with massive dilatation of most of the colon. GI was consulted performed a sigmoidoscopy for decompression earlier this afternoon.  Denies h/o prior abdominal surgery Anticoagulant use includes Plavix, ASA 37m  ROS: All systems reviewed and otherwise negative except for as above  Family History  Problem Relation Age of Onset  . Family history unknown: Yes    Past Medical History:  Diagnosis Date  . Atrial fibrillation ([place]   . BPV (benign positional vertigo)   . Bradycardia   . Dementia   . High cholesterol   . Hypertension   . Seizures ([place]   . Stroke ([facility]     Past Surgical History:  Procedure Laterality Date  . PACEMAKER INSERTION      Social History:  reports that he has quit smoking. He has never used smokeless tobacco. He reports that he does not drink alcohol or use drugs.  Allergies: No Known Allergies  Medications Prior to  Admission  Medication Sig Dispense Refill  . amLODipine (NORVASC) 10 MG tablet Take 10 mg by mouth daily.    .Marland Kitchenaspirin EC 81 MG tablet Take 81 mg by mouth.    .Marland Kitchenatorvastatin (LIPITOR) 80 MG tablet Take 80 mg by mouth daily.    . clopidogrel (PLAVIX) 75 MG tablet Take 75 mg by mouth daily.    .Marland Kitchendonepezil (ARICEPT) 10 MG tablet Take 10 mg by mouth at bedtime.     . dorzolamide-timolol (COSOPT) 22.3-6.8 MG/ML ophthalmic solution Place 1 drop into both eyes 2 (two) times daily.    .Marland Kitchenlatanoprost (XALATAN) 0.005 % ophthalmic solution Place 1 drop into both eyes at bedtime.     . levETIRAcetam (KEPPRA) 500 MG tablet Take 500 mg by mouth.    .Marland Kitchenlisinopril (PRINIVIL,ZESTRIL) 40 MG tablet Take 40 mg by mouth daily.    .Marland Kitchenloratadine (CLARITIN) 10 MG tablet Take 10 mg by mouth daily.    . Magnesium Oxide 250 MG TABS TAKE 1 TABLET BY MOUTH DAILY AS DIRECTED    . Multiple Vitamin (MULTI-VITAMINS) TABS Take 1 tablet by mouth daily.     . Omega-3 1000 MG CAPS Take 2 capsules by mouth daily.     . potassium chloride SA (K-DUR,KLOR-CON) 20 MEQ tablet TAKE 2 TABLETS BY MOUTH TWICE DAILY WITH MEALS    . ranitidine (ZANTAC) 150 MG tablet Take 150 mg by mouth.    . ranitidine (ZANTAC) 150 MG tablet Take 150 mg by mouth 2 (  two) times daily.    . bimatoprost (LUMIGAN) 0.01 % SOLN Administer 1 drop to both eyes nightly.     . brimonidine-timolol (COMBIGAN) 0.2-0.5 % ophthalmic solution Administer 1 drop to both eyes every twelve (12) hours. Frequency:   Dosage:0.0     Instructions:  Note:    . diltiazem (CARTIA XT) 120 MG 24 hr capsule TAKE ONE CAPSULE BY MOUTH EVERY MORNING    . haloperidol (HALDOL) 5 MG tablet 1/2 to 1 at bedtime as needed    . Incontinence Supply Disposable ([employer] UNDERGARMENT X-ABSORB BELT) MISC Frequency:   Dosage:0     Instructions:  Note:Use as directed for urinary incontinence.    . meclizine (ANTIVERT) 12.5 MG tablet Frequency:   Dosage:0   MG  Instructions:  Note:TAKE 1 TABLET BY MOUTH THREE  TIMES DAILY AS NEEDED FOR VERTIGO    . THIAMINE HCL PO Take 1 tablet by mouth daily. Take as prescribed by Ophthalmologist       Blood pressure (!) 203/93, pulse 74, temperature 97.4 F (36.3 C), temperature source Oral, resp. rate (!) 21, height 6' 2" (1.88 m), weight 205 lb 7.5 oz (93.2 kg), SpO2 96 %. Physical Exam:  General: sleeping, frail AA male who is laying in bed in NAD HEENT: head is normocephalic, atraumatic. Mouth is pink and moist Heart: irregularly irregular rhythm. Palpable pedal pulses bilaterally Lungs: Respiratory effort nonlabored Abd: soft, NT/ND, present but hypoactive BS, no masses, hernias, or organomegaly MS: all 4 extremities are symmetrical with no cyanosis, clubbing, or edema. Skin: warm and dry with no masses, lesions, or rashes   Results for orders placed or performed during the hospital encounter of 04/20/16 (from the past 48 hour(s))  CBG monitoring, ED     Status: Abnormal   Collection Time: 04/20/16  9:03 PM  Result Value Ref Range   Glucose-Capillary 196 (H) 65 - 99 mg/dL  CBC with Differential     Status: None   Collection Time: 04/20/16  9:10 PM  Result Value Ref Range   WBC 7.0 4.0 - 10.5 K/uL   RBC 4.54 4.22 - 5.81 MIL/uL   Hemoglobin 14.0 13.0 - 17.0 g/dL   HCT 41.7 39.0 - 52.0 %   MCV 91.9 78.0 - 100.0 fL   MCH 30.8 26.0 - 34.0 pg   MCHC 33.6 30.0 - 36.0 g/dL   RDW 14.6 11.5 - 15.5 %   Platelets 212 150 - 400 K/uL   Neutrophils Relative % 77 %   Neutro Abs 5.4 1.7 - 7.7 K/uL   Lymphocytes Relative 19 %   Lymphs Abs 1.3 0.7 - 4.0 K/uL   Monocytes Relative 4 %   Monocytes Absolute 0.3 0.1 - 1.0 K/uL   Eosinophils Relative 0 %   Eosinophils Absolute 0.0 0.0 - 0.7 K/uL   Basophils Relative 0 %   Basophils Absolute 0.0 0.0 - 0.1 K/uL  Comprehensive metabolic panel     Status: Abnormal   Collection Time: 04/20/16  9:10 PM  Result Value Ref Range   Sodium 137 135 - 145 mmol/L   Potassium 3.9 3.5 - 5.1 mmol/L   Chloride 108 101 - 111  mmol/L   CO2 19 (L) 22 - 32 mmol/L   Glucose, Bld 210 (H) 65 - 99 mg/dL   BUN 20 6 - 20 mg/dL   Creatinine, Ser 1.52 (H) 0.61 - 1.24 mg/dL   Calcium 10.5 (H) 8.9 - 10.3 mg/dL   Total Protein 8.9 (H) 6.5 - 8.1 g/dL  Albumin 4.0 3.5 - 5.0 g/dL   AST 27 15 - 41 U/L   ALT 25 17 - 63 U/L   Alkaline Phosphatase 77 38 - 126 U/L   Total Bilirubin 0.6 0.3 - 1.2 mg/dL   GFR calc non Af Amer 41 (L) >60 mL/min   GFR calc Af Amer 47 (L) >60 mL/min    Comment: (NOTE) The eGFR has been calculated using the CKD EPI equation. This calculation has not been validated in all clinical situations. eGFR's persistently <60 mL/min signify possible Chronic Kidney Disease.    Anion gap 10 5 - 15  Troponin I     Status: None   Collection Time: 04/20/16  9:10 PM  Result Value Ref Range   Troponin I <0.03 <0.03 ng/mL  Urinalysis, Routine w reflex microscopic     Status: Abnormal   Collection Time: 04/20/16 11:02 PM  Result Value Ref Range   Color, Urine YELLOW YELLOW   APPearance CLOUDY (A) CLEAR   Specific Gravity, Urine 1.020 1.005 - 1.030   pH 5.5 5.0 - 8.0   Glucose, UA NEGATIVE NEGATIVE mg/dL   Hgb urine dipstick SMALL (A) NEGATIVE   Bilirubin Urine SMALL (A) NEGATIVE   Ketones, ur 15 (A) NEGATIVE mg/dL   Protein, ur >300 (A) NEGATIVE mg/dL   Nitrite NEGATIVE NEGATIVE   Leukocytes, UA NEGATIVE NEGATIVE  Urine microscopic-add on     Status: Abnormal   Collection Time: 04/20/16 11:02 PM  Result Value Ref Range   Squamous Epithelial / LPF 0-5 (A) NONE SEEN   WBC, UA 0-5 0 - 5 WBC/hpf   RBC / HPF 6-30 0 - 5 RBC/hpf   Bacteria, UA RARE (A) NONE SEEN   Casts HYALINE CASTS (A) NEGATIVE    Comment: GRANULAR CAST RED CELL CAST   Type and screen [facility] Deputy     Status: None   Collection Time: 04/21/16  4:25 AM  Result Value Ref Range   ABO/RH(D) A POS    Antibody Screen NEG    Sample Expiration 04/24/2016    Antibody Identification NON SPECIFIC COLD ANTIBODY    DAT, IgG NEG    Troponin I (q 6hr x 3)     Status: None   Collection Time: 04/21/16  4:33 AM  Result Value Ref Range   Troponin I <0.03 <0.03 ng/mL  CBC     Status: None   Collection Time: 04/21/16  4:33 AM  Result Value Ref Range   WBC 6.4 4.0 - 10.5 K/uL   RBC 4.87 4.22 - 5.81 MIL/uL   Hemoglobin 14.7 13.0 - 17.0 g/dL   HCT 45.2 39.0 - 52.0 %   MCV 92.8 78.0 - 100.0 fL   MCH 30.2 26.0 - 34.0 pg   MCHC 32.5 30.0 - 36.0 g/dL   RDW 14.6 11.5 - 15.5 %   Platelets 211 150 - 400 K/uL  Protime-INR     Status: Abnormal   Collection Time: 04/21/16  4:33 AM  Result Value Ref Range   Prothrombin Time 16.1 (H) 11.4 - 15.2 seconds   INR 1.28   APTT     Status: None   Collection Time: 04/21/16  4:33 AM  Result Value Ref Range   aPTT 33 24 - 36 seconds  Troponin I (q 6hr x 3)     Status: None   Collection Time: 04/21/16  9:10 AM  Result Value Ref Range   Troponin I <0.03 <0.03 ng/mL  CBC  Status: None   Collection Time: 04/21/16  9:10 AM  Result Value Ref Range   WBC 6.6 4.0 - 10.5 K/uL   RBC 5.15 4.22 - 5.81 MIL/uL   Hemoglobin 15.8 13.0 - 17.0 g/dL   HCT 47.3 39.0 - 52.0 %   MCV 91.8 78.0 - 100.0 fL   MCH 30.7 26.0 - 34.0 pg   MCHC 33.4 30.0 - 36.0 g/dL   RDW 14.6 11.5 - 15.5 %   Platelets 198 150 - 400 K/uL  Glucose, capillary     Status: Abnormal   Collection Time: 04/21/16  1:25 PM  Result Value Ref Range   Glucose-Capillary 109 (H) 65 - 99 mg/dL   Comment 1 Notify RN    Comment 2 Document in Chart   Troponin I (q 6hr x 3)     Status: None   Collection Time: 04/21/16  2:20 PM  Result Value Ref Range   Troponin I <0.03 <0.03 ng/mL  CBC     Status: None   Collection Time: 04/21/16  2:20 PM  Result Value Ref Range   WBC 9.3 4.0 - 10.5 K/uL   RBC 4.92 4.22 - 5.81 MIL/uL   Hemoglobin 15.1 13.0 - 17.0 g/dL   HCT 45.5 39.0 - 52.0 %   MCV 92.5 78.0 - 100.0 fL   MCH 30.7 26.0 - 34.0 pg   MCHC 33.2 30.0 - 36.0 g/dL   RDW 14.6 11.5 - 15.5 %   Platelets 184 150 - 400 K/uL  APTT      Status: None   Collection Time: 04/21/16  2:20 PM  Result Value Ref Range   aPTT 33 24 - 36 seconds  Protime-INR     Status: Abnormal   Collection Time: 04/21/16  2:20 PM  Result Value Ref Range   Prothrombin Time 16.7 (H) 11.4 - 15.2 seconds   INR 1.34    Ct Head Wo Contrast  Result Date: 04/20/2016 CLINICAL DATA:  Altered mental status. Dementia. Chest pain, cough, and shortness of breath. EXAM: CT HEAD WITHOUT CONTRAST TECHNIQUE: Contiguous axial images were obtained from the base of the skull through the vertex without intravenous contrast. COMPARISON:  None. FINDINGS: Brain: Prominent diffuse cerebral atrophy. Ventricular dilatation consistent with central atrophy. Low-attenuation changes in the deep white matter consistent small vessel ischemia. No mass effect or midline shift. No abnormal extra-axial fluid collections. Gray-white matter junctions are distinct. Basal cisterns are not effaced. No evidence of acute intracranial hemorrhage. Vascular: Atherosclerotic vascular calcifications are present. Skull: Normal. Negative for fracture or focal lesion. Sinuses/Orbits: There is an air-fluid level in the right maxillary antrum. This may indicate sinusitis. Paranasal sinuses and mastoid air cells are otherwise clear. Other: None. IMPRESSION: No acute intracranial abnormalities. Prominent cerebral atrophy and small vessel ischemic changes. Air-fluid level in the right maxillary antrum may indicate sinus infection. Electronically Signed   By: Lucienne Capers M.D.   On: 04/20/2016 23:06   Ct Angio Chest Pe W Or Wo Contrast  Result Date: 04/21/2016 CLINICAL DATA:  Shortness of breath, chest pain and abdominal pain. EXAM: CT ANGIOGRAPHY CHEST CT ABDOMEN AND PELVIS WITH CONTRAST TECHNIQUE: Multidetector CT imaging of the chest was performed using the standard protocol during bolus administration of intravenous contrast. Multiplanar CT image reconstructions and MIPs were obtained to evaluate the  vascular anatomy. Multidetector CT imaging of the abdomen and pelvis was performed using the standard protocol during bolus administration of intravenous contrast. CONTRAST:  80 mL Isovue 370 IV COMPARISON:  Prior CT of the abdomen and pelvis at [facility] on 11/02/2012 FINDINGS: CTA CHEST FINDINGS Cardiovascular: Pulmonary arterial opacification is somewhat limited in the peripheral lungs. No pulmonary embolism is identified. The thoracic aorta demonstrates calcified plaque without aneurysmal disease. The heart size is normal. No pericardial fluid identified. Probable calcified plaque in the distribution of the left circumflex coronary artery and also potentially the right coronary artery. Mediastinum/Nodes: No evidence of mediastinal, hilar or axillary lymphadenopathy. No mediastinal masses or fluid collections. The thyroid gland appears unremarkable. Lungs/Pleura: Patchy alveolar airspace disease is noted in the right upper lobe, right middle lobe, right lower lobe and left lower lobe. The most significant involvement is in the right lower lobe. Findings are consistent with pneumonia and may reflect aspiration. No associated pulmonary edema or pleural effusions. No pneumothorax identified. There is a component of bronchial obstruction, likely reflecting mucous plug formation in the medial and posterior right lower lobe. Musculoskeletal: Spondylosis of the thoracic spine. No bony lesions or fractures identified. Review of the MIP images confirms the above findings. CT ABDOMEN and PELVIS FINDINGS Hepatobiliary: Stable hepatic cysts. The largest remains at the dome of the liver measures approximately 2.4 cm. No evidence of hepatic masses or biliary obstruction. The gallbladder is unremarkable. Pancreas: Unremarkable. No pancreatic ductal dilatation or surrounding inflammatory changes. Spleen: Normal in size without focal abnormality. Adrenals/Urinary Tract: Adrenal glands are unremarkable. Kidneys are  normal, without renal calculi, focal lesion, or hydronephrosis. Bladder is unremarkable and decompressed. Stomach/Bowel: There is a small hiatal hernia. The stomach contains a moderate amount of fluid. There is massive gaseous dilatation of much of the colon with maximal colonic diameter of approximately 11.8 cm. As the colon is followed by CT, there is a transition at the level of the sigmoid colon with decompression of the distal sigmoid and rectum. There is a swirling appearance of the proximal sigmoid and adjacent vessels within the mesentery and findings are suspicious for a sigmoid volvulus. No associated free air or pneumatosis identified. The small bowel is decompressed. No abscess is identified. Vascular/Lymphatic: No enlarged lymph nodes are seen. The abdominal aorta is calcified and without evidence of aneurysm. Reproductive: Unremarkable. Other: Small left inguinal hernia contains a short segment of nondilated small bowel. There is no evidence to suggest incarceration. Musculoskeletal: Bony structures show spondylosis of the lumbar spine. Review of the MIP images confirms the above findings. IMPRESSION: 1. Evidence of bilateral pneumonia, right greater than left. Findings may reflect aspiration. 2. Coronary and aortic atherosclerosis. No evidence of aortic aneurysm. 3. Findings by CT concerning for sigmoid volvulus with significant gaseous distention of the colon proximal to a level of relative beaking of the sigmoid colon and decompression of the sigmoid and rectum distal to the transition point. Recommend surgical and gastroenterology consultation. 4. These results will be called to the ordering clinician or representative by the Radiologist Assistant, and communication documented in the PACS or zVision Dashboard. Electronically Signed   By: Aletta Edouard M.D.   On: 04/21/2016 13:19   Ct Abdomen Pelvis W Contrast  Result Date: 04/21/2016 CLINICAL DATA:  Shortness of breath, chest pain and  abdominal pain. EXAM: CT ANGIOGRAPHY CHEST CT ABDOMEN AND PELVIS WITH CONTRAST TECHNIQUE: Multidetector CT imaging of the chest was performed using the standard protocol during bolus administration of intravenous contrast. Multiplanar CT image reconstructions and MIPs were obtained to evaluate the vascular anatomy. Multidetector CT imaging of the abdomen and pelvis was performed using the standard protocol during bolus administration of intravenous  contrast. CONTRAST:  80 mL Isovue 370 IV COMPARISON:  Prior CT of the abdomen and pelvis at [facility] on 11/02/2012 FINDINGS: CTA CHEST FINDINGS Cardiovascular: Pulmonary arterial opacification is somewhat limited in the peripheral lungs. No pulmonary embolism is identified. The thoracic aorta demonstrates calcified plaque without aneurysmal disease. The heart size is normal. No pericardial fluid identified. Probable calcified plaque in the distribution of the left circumflex coronary artery and also potentially the right coronary artery. Mediastinum/Nodes: No evidence of mediastinal, hilar or axillary lymphadenopathy. No mediastinal masses or fluid collections. The thyroid gland appears unremarkable. Lungs/Pleura: Patchy alveolar airspace disease is noted in the right upper lobe, right middle lobe, right lower lobe and left lower lobe. The most significant involvement is in the right lower lobe. Findings are consistent with pneumonia and may reflect aspiration. No associated pulmonary edema or pleural effusions. No pneumothorax identified. There is a component of bronchial obstruction, likely reflecting mucous plug formation in the medial and posterior right lower lobe. Musculoskeletal: Spondylosis of the thoracic spine. No bony lesions or fractures identified. Review of the MIP images confirms the above findings. CT ABDOMEN and PELVIS FINDINGS Hepatobiliary: Stable hepatic cysts. The largest remains at the dome of the liver measures approximately 2.4 cm. No  evidence of hepatic masses or biliary obstruction. The gallbladder is unremarkable. Pancreas: Unremarkable. No pancreatic ductal dilatation or surrounding inflammatory changes. Spleen: Normal in size without focal abnormality. Adrenals/Urinary Tract: Adrenal glands are unremarkable. Kidneys are normal, without renal calculi, focal lesion, or hydronephrosis. Bladder is unremarkable and decompressed. Stomach/Bowel: There is a small hiatal hernia. The stomach contains a moderate amount of fluid. There is massive gaseous dilatation of much of the colon with maximal colonic diameter of approximately 11.8 cm. As the colon is followed by CT, there is a transition at the level of the sigmoid colon with decompression of the distal sigmoid and rectum. There is a swirling appearance of the proximal sigmoid and adjacent vessels within the mesentery and findings are suspicious for a sigmoid volvulus. No associated free air or pneumatosis identified. The small bowel is decompressed. No abscess is identified. Vascular/Lymphatic: No enlarged lymph nodes are seen. The abdominal aorta is calcified and without evidence of aneurysm. Reproductive: Unremarkable. Other: Small left inguinal hernia contains a short segment of nondilated small bowel. There is no evidence to suggest incarceration. Musculoskeletal: Bony structures show spondylosis of the lumbar spine. Review of the MIP images confirms the above findings. IMPRESSION: 1. Evidence of bilateral pneumonia, right greater than left. Findings may reflect aspiration. 2. Coronary and aortic atherosclerosis. No evidence of aortic aneurysm. 3. Findings by CT concerning for sigmoid volvulus with significant gaseous distention of the colon proximal to a level of relative beaking of the sigmoid colon and decompression of the sigmoid and rectum distal to the transition point. Recommend surgical and gastroenterology consultation. 4. These results will be called to the ordering clinician or  representative by the Radiologist Assistant, and communication documented in the PACS or zVision Dashboard. Electronically Signed   By: Aletta Edouard M.D.   On: 04/21/2016 13:19   Dg Chest Portable 1 View  Result Date: 04/20/2016 CLINICAL DATA:  Dyspnea since this evening. History of dementia, infarct, hypertension and pacemaker all. EXAM: PORTABLE CHEST 1 VIEW COMPARISON:  12/25/2015. FINDINGS: Cardiomegaly with tortuous ectatic appearing thoracic aorta. Right ventricular pacing wire is noted with left-sided pacemaker apparatus. Skin fold artifact along the periphery of the right hemithorax. No pneumonic consolidation, CHF, pneumothorax nor effusion. Moderate colonic distention below  the diaphragm up to 8 cm possibly representing pseudo-obstruction. No suspicious osseous abnormalities. IMPRESSION: No acute cardiopulmonary disease. Cardiomegaly. Skin fold artifact along the periphery of the right thorax. Electronically Signed   By: Ashley Royalty M.D.   On: 04/20/2016 22:07      Assessment/Plan Sigmoid volvulus - CT: Findings by CT concerning for sigmoid volvulus with significant gaseous distention of the colon proximal to a level of relative beaking of the sigmoid colon and decompression of the sigmoid and rectum distal to the transition point. - s/p sigmoidoscopy for decompression this afternoon  Bilateral pneumonia - likely HCAP, started on Cef/vanc per family medicine Afib S/p pacemaker placement HTN HLD H/o stroke Dementia   Plan - s/p sigmoidoscopy for decompression this afternoon, resting comfortably now. Will discuss role of surgery with MD.  Jerrye Beavers, [facility] Surgery 04/21/2016, 3:13 PM Pager: [phone] Consults: [phone] Mon-Fri 7:00 am-4:30 pm Sat-Sun 7:00 am-11:30 am

## 2016-04-21 NOTE — Op Note (Signed)
[facility] Patient Name: Edwin Wilson Procedure Date : 04/21/2016 MRN: [id number] Attending MD: Napoleon Form , MD Date of Birth: [birth year] CSN: [id number] Age: 82 Admit Type: Inpatient Procedure:                Flexible Sigmoidoscopy Indications:              Volvulus Providers:                Napoleon Form, MD, Priscella Mann, RN,                            Clearnce Sorrel, Technician Referring MD:              Medicines:                Fentanyl 25 micrograms IV, Midazolam 1 mg IV Complications:            No immediate complications. Estimated Blood Loss:     Estimated blood loss: none. Procedure:                Pre-Anesthesia Assessment:                           - Prior to the procedure, a History and Physical                            was performed, and patient medications and                            allergies were reviewed. The patient's tolerance of                            previous anesthesia was also reviewed. The risks                            and benefits of the procedure and the sedation                            options and risks were discussed with the patient.                            All questions were answered, and informed consent                            was obtained. Prior Anticoagulants: The patient                            last took Plavix (clopidogrel) 1 day prior to the                            procedure. ASA Grade Assessment: IV - A patient                            with severe systemic disease that is a constant  threat to life. After reviewing the risks and                            benefits, the patient was deemed in satisfactory                            condition to undergo the procedure.                           After obtaining informed consent, the scope was                            passed under direct vision. The EC-[id number] ([id number]                            scope was  introduced through the anus and advanced                            to the the left transverse colon. The flexible                            sigmoidoscopy was accomplished without difficulty.                            The patient tolerated the procedure well. The                            quality of the bowel preparation was Unprepped                            colon. Scope In: 3:10:22 PM Scope Out: 3:21:29 PM Total Procedure Duration: 0 hours 11 minutes 7 seconds  Findings:      The perianal and digital rectal examinations were normal.      A volvulus, with viable appearing mucosa, was found in the distal       sigmoid colon. Decompression of the volvulus was attempted and was       successful, with complete decompression achieved. Estimated blood loss       was minimal. Scope couldnt be extended beyond distal transverse colon       due to unprepped colon with poor visualization Impression:               - Volvulus. Successful complete decompression                            achieved.                           - No specimens collected. Moderate Sedation:      Moderate (conscious) sedation was administered by the endoscopy nurse       and supervised by the endoscopist. The following parameters were       monitored: oxygen saturation, heart rate, blood pressure, and response       to care. Total physician intraservice time was 12 minutes. Recommendation:           - Use Benefiber  two teaspoons PO TID.                           - Dulcolax 1-3 tabs or caps PO daily to have 1-2                            soft BM per day.                           - Advance diet as tolerated.                           - Continue present medications.                           -Out of bed to chair and ambulate as tolerated                           - Consult surgery for possible sigmoid pexy to                            prevent recurrence                           -Will sign off, available for any  questions Procedure Code(s):        --- Professional ---                           [id number], Sigmoidoscopy, flexible; with decompression                            (for pathologic distention) (eg, volvulus,                            megacolon), including placement of decompression                            tube, when performed                           G0500, Moderate sedation services provided by the                            same physician or other qualified health care                            professional performing a gastrointestinal                            endoscopic service that sedation supports,                            requiring the presence of an independent trained                            observer to assist in the monitoring of  the                            patient's level of consciousness and physiological                            status; initial 15 minutes of intra-service time;                            patient age 77 years or older (additional time may                            be reported with 47829, as appropriate) Diagnosis Code(s):        --- Professional ---                           K56.2, Volvulus CPT copyright 2016 [employer] Association. All rights reserved. The codes documented in this report are preliminary and upon coder review may  be revised to meet current compliance requirements. Napoleon Form, MD 04/21/2016 3:40:18 PM This report has been signed electronically. Number of Addenda: 0

## 2016-04-21 NOTE — H&P (Signed)
History and Physical    Edwin Wilson ZOX:[id number] DOB: 1929-11-30 DOA: 04/20/2016  PCP: Patria Mane, MD Patient coming from: home  Chief Complaint: SOB  HPI: Edwin Wilson is a 82 y.o. male with medical history significant of dementia, HLD, HTN, CVA.   Level V caveat applies as patient presenting in altered mental state and unable to provide reliable history given his dementia and acute illness.Marland Kitchen  History provided by patient, nursing staff and EDP. Attempts were made to reach family by phone but were unavailable. No family in patient's room at time of examination. Per report patient presented to [facility] emergency room and approximately 2053 on 04/20/2016 complaining of shortness of breath, and sweating. Shortness of breath was described as constant with associated chest pain. Started in the morning, 04/20/2016. Patient also complained of abdominal pain over the course of the day intermittently. Associated with cough with clear sputum and anorexia. Mental status is at baseline. Patient living at assisted living facility until approximately 1 week ago which point time he was brought home. No other focal complaints such as lower extremity swelling, fevers, vomiting, diarrhea, dysuria, frequency, focal neurological deficit.  In the ED nursing staff reported single episode of bloody emesis though does not clear at this point time if it was emesis or hemoptysis  ED Course: After reported hematemesis patient was started on a Protonix drip and serial CBCs were ordered. Patient noted to have atrial flutter which is a new finding. Patient also noted to have AK I and low O2 saturations 70%.  Review of Systems: As per HPI otherwise 10 point review of systems negative.   Ambulatory Status:unclear given Dementia, lack of family input and acute illness.   Past Medical History:  Diagnosis Date  . Atrial fibrillation (HCC)   . BPV (benign positional vertigo)   . Bradycardia   . Dementia     . High cholesterol   . Hypertension   . Seizures (HCC)   . Stroke [facility])     Past Surgical History:  Procedure Laterality Date  . PACEMAKER INSERTION      Social History   Social History  . Marital status: Married    Spouse name: N/A  . Number of children: N/A  . Years of education: N/A   Occupational History  . Not on file.   Social History Main Topics  . Smoking status: Former Games developer  . Smokeless tobacco: Never Used  . Alcohol use No  . Drug use: No  . Sexual activity: Not on file   Other Topics Concern  . Not on file   Social History Narrative  . No narrative on file    No Known Allergies  Family History  Problem Relation Age of Onset  . Family history unknown: Yes    Prior to Admission medications   Medication Sig Start Date End Date Taking? Authorizing Provider  amLODipine (NORVASC) 10 MG tablet Take 10 mg by mouth. 10/26/14 04/20/16 Yes Historical Provider, MD  aspirin EC 81 MG tablet Take 81 mg by mouth. 12/30/09  Yes Historical Provider, MD  atorvastatin (LIPITOR) 80 MG tablet Take 80 mg by mouth. 10/26/14 04/20/16 Yes Historical Provider, MD  donepezil (ARICEPT) 10 MG tablet Take 10 mg by mouth. 11/14/14  Yes Historical Provider, MD  dorzolamide-timolol (COSOPT) 22.3-6.8 MG/ML ophthalmic solution Place 1 drop into both eyes 2 (two) times daily.   Yes Historical Provider, MD  latanoprost (XALATAN) 0.005 % ophthalmic solution 1 drop at bedtime.   Yes Historical Provider, MD  levETIRAcetam (KEPPRA) 500 MG tablet Take 500 mg by mouth. 11/14/14  Yes Historical Provider, MD  lisinopril (PRINIVIL,ZESTRIL) 30 MG tablet Take 40 mg by mouth.  10/26/14 04/20/16 Yes Historical Provider, MD  Magnesium Oxide 250 MG TABS TAKE 1 TABLET BY MOUTH DAILY AS DIRECTED 05/18/14  Yes Historical Provider, MD  Multiple Vitamin (MULTI-VITAMINS) TABS Take by mouth.   Yes Historical Provider, MD  Omega-3 1000 MG CAPS Take by mouth.   Yes Historical Provider, MD  potassium chloride SA  (K-DUR,KLOR-CON) 20 MEQ tablet TAKE 2 TABLETS BY MOUTH TWICE DAILY WITH MEALS 11/22/14  Yes Historical Provider, MD  ranitidine (ZANTAC) 150 MG tablet Take 150 mg by mouth. 10/26/14 04/20/16 Yes Historical Provider, MD  bimatoprost (LUMIGAN) 0.01 % SOLN Administer 1 drop to both eyes nightly.  10/29/10   Historical Provider, MD  brimonidine-timolol (COMBIGAN) 0.2-0.5 % ophthalmic solution Administer 1 drop to both eyes every twelve (12) hours. Frequency:   Dosage:0.0     Instructions:  Note: 07/29/10   Historical Provider, MD  diltiazem (CARTIA XT) 120 MG 24 hr capsule TAKE ONE CAPSULE BY MOUTH EVERY MORNING 05/28/14   Historical Provider, MD  haloperidol (HALDOL) 5 MG tablet 1/2 to 1 at bedtime as needed 11/14/14   Historical Provider, MD  Incontinence Supply Disposable ([employer] UNDERGARMENT X-ABSORB BELT) MISC Frequency:   Dosage:0     Instructions:  Note:Use as directed for urinary incontinence. 04/26/13   Historical Provider, MD  meclizine (ANTIVERT) 12.5 MG tablet Frequency:   Dosage:0   MG  Instructions:  Note:TAKE 1 TABLET BY MOUTH THREE TIMES DAILY AS NEEDED FOR VERTIGO 04/13/13   Historical Provider, MD  THIAMINE HCL PO Take by mouth. Take as prescribed by Ophthalmologist    Historical Provider, MD    Physical Exam: Vitals:   04/21/16 0130 04/21/16 0235 04/21/16 0341 04/21/16 0610  BP: (!) 161/102 (!) 164/91  (!) 149/96  Pulse: 102 97    Resp: 22 20    Temp:  97.4 F (36.3 C)    TempSrc:  Oral Oral   SpO2: 98% 96%    Weight:  93.2 kg (205 lb 7.5 oz)    Height:         General: ill appearing. Resting in bed.  Eyes:  PERRL, EOMI, normal lids, iris ENT: poor dentition, dry mm Neck:  no LAD, masses or thyromegaly Cardiovascular: irregularly irregular, 3/6 systolic murmur No LE edema.  Respiratory: crackles in bases w/ decreased breath sounds. nml effort.  Abdomen: distended, nonttp, hypoactive BS Skin:  no rash or induration seen on limited exam Musculoskeletal:  grossly normal tone BUE/BLE,  good ROM, no bony abnormality Psychiatric: Pleasant. Follows basic commands. Unable to provide reliable response to questions given dementia Neurologic:  CN 2-12 grossly intact, moves all extremities in coordinated fashion, sensation intact  Labs on Admission: I have personally reviewed following labs and imaging studies  CBC:  Recent Labs Lab 04/20/16 2110 04/21/16 0433 04/21/16 0910  WBC 7.0 6.4 6.6  NEUTROABS 5.4  --   --   HGB 14.0 14.7 15.8  HCT 41.7 45.2 47.3  MCV 91.9 92.8 91.8  PLT 212 211 198   Basic Metabolic Panel:  Recent Labs Lab 04/20/16 2110  NA 137  K 3.9  CL 108  CO2 19*  GLUCOSE 210*  BUN 20  CREATININE 1.52*  CALCIUM 10.5*   GFR: Estimated Creatinine Clearance: 42.8 mL/min (by C-G formula based on SCr of 1.52 mg/dL (H)). Liver Function Tests:  Recent Labs  Lab 04/20/16 2110  AST 27  ALT 25  ALKPHOS 77  BILITOT 0.6  PROT 8.9*  ALBUMIN 4.0   No results for input(s): LIPASE, AMYLASE in the last 168 hours. No results for input(s): AMMONIA in the last 168 hours. Coagulation Profile:  Recent Labs Lab 04/21/16 0433  INR 1.28   Cardiac Enzymes:  Recent Labs Lab 04/20/16 2110 04/21/16 0433 04/21/16 0910  TROPONINI <0.03 <0.03 <0.03   BNP (last 3 results) No results for input(s): PROBNP in the last 8760 hours. HbA1C: No results for input(s): HGBA1C in the last 72 hours. CBG:  Recent Labs Lab 04/20/16 2103  GLUCAP 196*   Lipid Profile: No results for input(s): CHOL, HDL, LDLCALC, TRIG, CHOLHDL, LDLDIRECT in the last 72 hours. Thyroid Function Tests: No results for input(s): TSH, T4TOTAL, FREET4, T3FREE, THYROIDAB in the last 72 hours. Anemia Panel: No results for input(s): VITAMINB12, FOLATE, FERRITIN, TIBC, IRON, RETICCTPCT in the last 72 hours. Urine analysis:    Component Value Date/Time   COLORURINE YELLOW 04/20/2016 2302   APPEARANCEUR CLOUDY (A) 04/20/2016 2302   LABSPEC 1.020 04/20/2016 2302   PHURINE 5.5  04/20/2016 2302   GLUCOSEU NEGATIVE 04/20/2016 2302   HGBUR SMALL (A) 04/20/2016 2302   BILIRUBINUR SMALL (A) 04/20/2016 2302   KETONESUR 15 (A) 04/20/2016 2302   PROTEINUR >300 (A) 04/20/2016 2302   NITRITE NEGATIVE 04/20/2016 2302   LEUKOCYTESUR NEGATIVE 04/20/2016 2302    Creatinine Clearance: Estimated Creatinine Clearance: 42.8 mL/min (by C-G formula based on SCr of 1.52 mg/dL (H)).  Sepsis Labs: @LABRCNTIP (procalcitonin:4,lacticidven:4) )No results found for this or any previous visit (from the past 240 hour(s)).   Radiological Exams on Admission: Ct Head Wo Contrast  Result Date: 04/20/2016 CLINICAL DATA:  Altered mental status. Dementia. Chest pain, cough, and shortness of breath. EXAM: CT HEAD WITHOUT CONTRAST TECHNIQUE: Contiguous axial images were obtained from the base of the skull through the vertex without intravenous contrast. COMPARISON:  None. FINDINGS: Brain: Prominent diffuse cerebral atrophy. Ventricular dilatation consistent with central atrophy. Low-attenuation changes in the deep white matter consistent small vessel ischemia. No mass effect or midline shift. No abnormal extra-axial fluid collections. Gray-white matter junctions are distinct. Basal cisterns are not effaced. No evidence of acute intracranial hemorrhage. Vascular: Atherosclerotic vascular calcifications are present. Skull: Normal. Negative for fracture or focal lesion. Sinuses/Orbits: There is an air-fluid level in the right maxillary antrum. This may indicate sinusitis. Paranasal sinuses and mastoid air cells are otherwise clear. Other: None. IMPRESSION: No acute intracranial abnormalities. Prominent cerebral atrophy and small vessel ischemic changes. Air-fluid level in the right maxillary antrum may indicate sinus infection. Electronically Signed   By: Burman Nieves M.D.   On: 04/20/2016 23:06   Dg Chest Portable 1 View  Result Date: 04/20/2016 CLINICAL DATA:  Dyspnea since this evening. History of  dementia, infarct, hypertension and pacemaker all. EXAM: PORTABLE CHEST 1 VIEW COMPARISON:  12/25/2015. FINDINGS: Cardiomegaly with tortuous ectatic appearing thoracic aorta. Right ventricular pacing wire is noted with left-sided pacemaker apparatus. Skin fold artifact along the periphery of the right hemithorax. No pneumonic consolidation, CHF, pneumothorax nor effusion. Moderate colonic distention below the diaphragm up to 8 cm possibly representing pseudo-obstruction. No suspicious osseous abnormalities. IMPRESSION: No acute cardiopulmonary disease. Cardiomegaly. Skin fold artifact along the periphery of the right thorax. Electronically Signed   By: Tollie Eth M.D.   On: 04/20/2016 22:07    EKG: Independently reviewed.   Assessment/Plan Active Problems:   Chest pain   Shortness of  breath   Essential hypertension   Dementia with behavioral disturbance   Seizures (HCC)   BPV (benign positional vertigo)   Hyperglycemia   Chest pain/SOB: resolved??? Pt demented and unable to provide any real history. Trop Neg x2. Likely due to new onset Afib. Low likelyhood for infectious process. Pt w/ pacemaker in place and per review of care everywhere he has been on dilt in the past. Doubt hypoxemia report as multiple VS irregularities at time of that reading - likely technician error. Complaint of SOB and abd pain/distension concerning for intraabdominal process though no elevated WBC vs PE???. No previous Cards eval. Trop Neg x2 - Tele - CT abd/chest - EKG in am - cycle trop - Cards eval given new onset Afib though w/ Pacemaker (for bradycardia) - continue ASA, Plavix, Statin  Hematemesis/Hemoptosys: ??? If this actually happened as there is no nursing report. History simply given as part of physician sign out. Hgb stable. No blood thinners.  - continue protonix drip until am - continue CBC Q12 - workup as above  HTN: - continue lisinopril, Dilt, norvasc  Dementia: at baseline per report  -  continue aricept, Haldol  Seizures: no recent seizures - Continue keppra  Vertigo: - continue antivert  Hyperglycemia: No h/o DM - SSI - A1c   DVT prophylaxis: hep  Code Status: full - presumed  Family Communication: none - attempted to call family but no answer  Disposition Plan: pending workup and improvement  Consults called: Cardiology  Admission status: inpt    Megahn Killings J MD [facility]  If 7PM-7AM, please contact night-coverage [email or web address] Password [email or web address]  04/21/2016, 11:36 AM

## 2016-04-21 NOTE — Consult Note (Signed)
CARDIOLOGY CONSULT NOTE   Patient ID: Edwin Wilson MRN: [id number] DOB/AGE: Sep 22, 1929 82 y.o.  Admit date: 04/20/2016  Primary Physician   Patria Mane, MD Primary Cardiologist    Orinda Kenner, [facility]  [address]  [address]  [place], [place] [address]  [phone]  [phone] (Fax)  Reason for Consultation   afib  Requesting Physician  Dr. Konrad Dolores  HPI: Edwin Wilson is a 82 y.o. male with a history of Bradycardia status post Medtronic single-chamber pacemaker, stroke, hypertension, hyperlipidemia and dementia who presented with altered mental status, shortness of breath and chest pain.  Limited history as patient in endo for  flexible sigmoidoscopy. History of obtained from reviewing chart. Last night patient had a episode of abdominal pain, shortness of breath with diaphoresis leading to ER presentation. Mild chest pain as well. His abdominal pain began in early in early in day. He was a concern regarding bloody emesis versus hemoptysis in ER. Patient was started on Protonix drips.  EKG shows atrial flutter with nonspecific ST abnormality. No prior EKG to compare. Repeat EKG shows A. fib at rate of 88 minutes. Serum creatinine of 1.52. He 62 without acute cardiopulmonary disease. CT of head without acute finding. CT and U of chest shows bilateral pneumonia with coronary atherosclerosis. CT of abdomen showed sigmoid volvulus with significant gaseous distention of the colon proximal to a level of relative beaking of the sigmoid colon and decompression of the sigmoid and rectum distal to the transition point. Currently in for sigmoidoscopy.   Past Medical History:  Diagnosis Date  . Atrial fibrillation (HCC)   . BPV (benign positional vertigo)   . Bradycardia   . Dementia   . High cholesterol   . Hypertension   . Seizures (HCC)   . Stroke [facility])      Past Surgical History:  Procedure Laterality Date  . PACEMAKER INSERTION      No Known  Allergies  I have reviewed the patient's current medications . [MAR Hold] amLODipine  10 mg Oral Daily  . [MAR Hold] aspirin EC  81 mg Oral Daily  . [MAR Hold] atorvastatin  80 mg Oral Daily  . [MAR Hold] bimatoprost  1 drop Both Eyes QHS  . [MAR Hold] brimonidine  1 drop Both Eyes Q12H   And  . [MAR Hold] timolol  1 drop Both Eyes BID  . [MAR Hold] ceFEPime (MAXIPIME) IV  1 g Intravenous Q24H  . [MAR Hold] clopidogrel  75 mg Oral Daily  . [MAR Hold] diltiazem  120 mg Oral q morning - 10a  . [MAR Hold] donepezil  10 mg Oral QHS  . [MAR Hold] dorzolamide-timolol  1 drop Both Eyes BID  . [MAR Hold] famotidine  20 mg Oral Daily  . [MAR Hold] insulin aspart  0-5 Units Subcutaneous QHS  . [MAR Hold] insulin aspart  0-9 Units Subcutaneous TID WC  . [MAR Hold] latanoprost  1 drop Both Eyes QHS  . [MAR Hold] levETIRAcetam  500 mg Oral BID  . [MAR Hold] lisinopril  40 mg Oral Daily  . [MAR Hold] potassium chloride SA  40 mEq Oral BID WC  . [MAR Hold] vancomycin  1,250 mg Intravenous Once  . [MAR Hold] vancomycin  750 mg Intravenous Q12H   . sodium chloride    . sodium chloride    . heparin    . pantoprozole (PROTONIX) infusion 8 mg/hr (04/21/16 0452)   [MAR Hold] acetaminophen **OR** [MAR Hold] acetaminophen, [MAR Hold] albuterol, [facility]  Hold] ALPRAZolam, [MAR Hold] gi cocktail, [MAR Hold] haloperidol, [MAR Hold] meclizine, [MAR Hold]  morphine injection, [MAR Hold] nitroGLYCERIN, [MAR Hold] ondansetron **OR** [MAR Hold] ondansetron (ZOFRAN) IV  Prior to Admission medications   Medication Sig Start Date End Date Taking? Authorizing Provider  amLODipine (NORVASC) 10 MG tablet Take 10 mg by mouth daily. 02/21/16 08/19/16 Yes Historical Provider, MD  aspirin EC 81 MG tablet Take 81 mg by mouth. 12/30/09  Yes Historical Provider, MD  atorvastatin (LIPITOR) 80 MG tablet Take 80 mg by mouth daily.   Yes Historical Provider, MD  clopidogrel (PLAVIX) 75 MG tablet Take 75 mg by mouth daily. 03/20/16  Yes  Historical Provider, MD  donepezil (ARICEPT) 10 MG tablet Take 10 mg by mouth at bedtime.  11/14/14  Yes Historical Provider, MD  dorzolamide-timolol (COSOPT) 22.3-6.8 MG/ML ophthalmic solution Place 1 drop into both eyes 2 (two) times daily.   Yes Historical Provider, MD  latanoprost (XALATAN) 0.005 % ophthalmic solution Place 1 drop into both eyes at bedtime.    Yes Historical Provider, MD  levETIRAcetam (KEPPRA) 500 MG tablet Take 500 mg by mouth. 11/14/14  Yes Historical Provider, MD  lisinopril (PRINIVIL,ZESTRIL) 40 MG tablet Take 40 mg by mouth daily. 03/20/16  Yes Historical Provider, MD  loratadine (CLARITIN) 10 MG tablet Take 10 mg by mouth daily. 09/19/15 09/18/16 Yes Historical Provider, MD  Magnesium Oxide 250 MG TABS TAKE 1 TABLET BY MOUTH DAILY AS DIRECTED 05/18/14  Yes Historical Provider, MD  Multiple Vitamin (MULTI-VITAMINS) TABS Take 1 tablet by mouth daily.    Yes Historical Provider, MD  Omega-3 1000 MG CAPS Take 2 capsules by mouth daily.    Yes Historical Provider, MD  potassium chloride SA (K-DUR,KLOR-CON) 20 MEQ tablet TAKE 2 TABLETS BY MOUTH TWICE DAILY WITH MEALS 11/22/14  Yes Historical Provider, MD  ranitidine (ZANTAC) 150 MG tablet Take 150 mg by mouth. 10/26/14 04/20/16 Yes Historical Provider, MD  ranitidine (ZANTAC) 150 MG tablet Take 150 mg by mouth 2 (two) times daily.   Yes Historical Provider, MD  bimatoprost (LUMIGAN) 0.01 % SOLN Administer 1 drop to both eyes nightly.  10/29/10   Historical Provider, MD  brimonidine-timolol (COMBIGAN) 0.2-0.5 % ophthalmic solution Administer 1 drop to both eyes every twelve (12) hours. Frequency:   Dosage:0.0     Instructions:  Note: 07/29/10   Historical Provider, MD  diltiazem (CARTIA XT) 120 MG 24 hr capsule TAKE ONE CAPSULE BY MOUTH EVERY MORNING 05/28/14   Historical Provider, MD  haloperidol (HALDOL) 5 MG tablet 1/2 to 1 at bedtime as needed 11/14/14   Historical Provider, MD  Incontinence Supply Disposable ([employer] UNDERGARMENT X-ABSORB BELT)  MISC Frequency:   Dosage:0     Instructions:  Note:Use as directed for urinary incontinence. 04/26/13   Historical Provider, MD  meclizine (ANTIVERT) 12.5 MG tablet Frequency:   Dosage:0   MG  Instructions:  Note:TAKE 1 TABLET BY MOUTH THREE TIMES DAILY AS NEEDED FOR VERTIGO 04/13/13   Historical Provider, MD  THIAMINE HCL PO Take 1 tablet by mouth daily. Take as prescribed by Ophthalmologist     Historical Provider, MD     Social History   Social History  . Marital status: Married    Spouse name: N/A  . Number of children: N/A  . Years of education: N/A   Occupational History  . Not on file.   Social History Main Topics  . Smoking status: Former Games developer  . Smokeless tobacco: Never Used  . Alcohol use No  .  Drug use: No  . Sexual activity: Not on file   Other Topics Concern  . Not on file   Social History Narrative  . No narrative on file    No family status information on file.   Family History  Problem Relation Age of Onset  . Family history unknown: Yes    ROS:  Full 14 point review of systems complete and found to be negative unless listed above.   Physical exam by M.D. Physical Exam: Blood pressure (!) 203/93, pulse 74, temperature 97.4 F (36.3 C), temperature source Oral, resp. rate (!) 21, height 6\' 2"  (1.88 m), weight 205 lb 7.5 oz (93.2 kg), SpO2 96 %.  General: Well developed, well nourished, male in no acute distress Head: Eyes PERRLA, No xanthomas. Normocephalic and atraumatic, oropharynx without edema or exudate.  Lungs: Resp regular and unlabored, CTA. Heart: RRR no s3, s4, or murmurs..   Neck: No carotid bruits. No lymphadenopathy.  JVD. Abdomen: Bowel sounds present, abdomen soft and non-tender without masses or hernias noted. Msk:  No spine or cva tenderness. No weakness, no joint deformities or effusions. Extremities: No clubbing, cyanosis or edema. DP/PT/Radials 2+ and equal bilaterally. Neuro: Alert and oriented X 3. No focal deficits  noted. Psych:  Good affect, responds appropriately Skin: No rashes or lesions noted.  Labs:   Lab Results  Component Value Date   WBC 9.3 04/21/2016   HGB 15.1 04/21/2016   HCT 45.5 04/21/2016   MCV 92.5 04/21/2016   PLT 184 04/21/2016    Recent Labs  04/21/16 1420  INR 1.34    Recent Labs Lab 04/20/16 2110  NA 137  K 3.9  CL 108  CO2 19*  BUN 20  CREATININE 1.52*  CALCIUM 10.5*  PROT 8.9*  BILITOT 0.6  ALKPHOS 77  ALT 25  AST 27  GLUCOSE 210*  ALBUMIN 4.0   No results found for: MG  Recent Labs  04/20/16 2110 04/21/16 0433 04/21/16 0910  TROPONINI <0.03 <0.03 <0.03      Radiology:  Ct Head Wo Contrast  Result Date: 04/20/2016 CLINICAL DATA:  Altered mental status. Dementia. Chest pain, cough, and shortness of breath. EXAM: CT HEAD WITHOUT CONTRAST TECHNIQUE: Contiguous axial images were obtained from the base of the skull through the vertex without intravenous contrast. COMPARISON:  None. FINDINGS: Brain: Prominent diffuse cerebral atrophy. Ventricular dilatation consistent with central atrophy. Low-attenuation changes in the deep white matter consistent small vessel ischemia. No mass effect or midline shift. No abnormal extra-axial fluid collections. Gray-white matter junctions are distinct. Basal cisterns are not effaced. No evidence of acute intracranial hemorrhage. Vascular: Atherosclerotic vascular calcifications are present. Skull: Normal. Negative for fracture or focal lesion. Sinuses/Orbits: There is an air-fluid level in the right maxillary antrum. This may indicate sinusitis. Paranasal sinuses and mastoid air cells are otherwise clear. Other: None. IMPRESSION: No acute intracranial abnormalities. Prominent cerebral atrophy and small vessel ischemic changes. Air-fluid level in the right maxillary antrum may indicate sinus infection. Electronically Signed   By: Burman NievesWilliam  Stevens M.D.   On: 04/20/2016 23:06   Ct Angio Chest Pe W Or Wo Contrast  Result  Date: 04/21/2016 CLINICAL DATA:  Shortness of breath, chest pain and abdominal pain. EXAM: CT ANGIOGRAPHY CHEST CT ABDOMEN AND PELVIS WITH CONTRAST TECHNIQUE: Multidetector CT imaging of the chest was performed using the standard protocol during bolus administration of intravenous contrast. Multiplanar CT image reconstructions and MIPs were obtained to evaluate the vascular anatomy. Multidetector CT imaging of the  abdomen and pelvis was performed using the standard protocol during bolus administration of intravenous contrast. CONTRAST:  80 mL Isovue 370 IV COMPARISON:  Prior CT of the abdomen and pelvis at [facility] on 11/02/2012 FINDINGS: CTA CHEST FINDINGS Cardiovascular: Pulmonary arterial opacification is somewhat limited in the peripheral lungs. No pulmonary embolism is identified. The thoracic aorta demonstrates calcified plaque without aneurysmal disease. The heart size is normal. No pericardial fluid identified. Probable calcified plaque in the distribution of the left circumflex coronary artery and also potentially the right coronary artery. Mediastinum/Nodes: No evidence of mediastinal, hilar or axillary lymphadenopathy. No mediastinal masses or fluid collections. The thyroid gland appears unremarkable. Lungs/Pleura: Patchy alveolar airspace disease is noted in the right upper lobe, right middle lobe, right lower lobe and left lower lobe. The most significant involvement is in the right lower lobe. Findings are consistent with pneumonia and may reflect aspiration. No associated pulmonary edema or pleural effusions. No pneumothorax identified. There is a component of bronchial obstruction, likely reflecting mucous plug formation in the medial and posterior right lower lobe. Musculoskeletal: Spondylosis of the thoracic spine. No bony lesions or fractures identified. Review of the MIP images confirms the above findings. CT ABDOMEN and PELVIS FINDINGS Hepatobiliary: Stable hepatic cysts. The largest  remains at the dome of the liver measures approximately 2.4 cm. No evidence of hepatic masses or biliary obstruction. The gallbladder is unremarkable. Pancreas: Unremarkable. No pancreatic ductal dilatation or surrounding inflammatory changes. Spleen: Normal in size without focal abnormality. Adrenals/Urinary Tract: Adrenal glands are unremarkable. Kidneys are normal, without renal calculi, focal lesion, or hydronephrosis. Bladder is unremarkable and decompressed. Stomach/Bowel: There is a small hiatal hernia. The stomach contains a moderate amount of fluid. There is massive gaseous dilatation of much of the colon with maximal colonic diameter of approximately 11.8 cm. As the colon is followed by CT, there is a transition at the level of the sigmoid colon with decompression of the distal sigmoid and rectum. There is a swirling appearance of the proximal sigmoid and adjacent vessels within the mesentery and findings are suspicious for a sigmoid volvulus. No associated free air or pneumatosis identified. The small bowel is decompressed. No abscess is identified. Vascular/Lymphatic: No enlarged lymph nodes are seen. The abdominal aorta is calcified and without evidence of aneurysm. Reproductive: Unremarkable. Other: Small left inguinal hernia contains a short segment of nondilated small bowel. There is no evidence to suggest incarceration. Musculoskeletal: Bony structures show spondylosis of the lumbar spine. Review of the MIP images confirms the above findings. IMPRESSION: 1. Evidence of bilateral pneumonia, right greater than left. Findings may reflect aspiration. 2. Coronary and aortic atherosclerosis. No evidence of aortic aneurysm. 3. Findings by CT concerning for sigmoid volvulus with significant gaseous distention of the colon proximal to a level of relative beaking of the sigmoid colon and decompression of the sigmoid and rectum distal to the transition point. Recommend surgical and gastroenterology  consultation. 4. These results will be called to the ordering clinician or representative by the Radiologist Assistant, and communication documented in the PACS or zVision Dashboard. Electronically Signed   By: Irish Lack M.D.   On: 04/21/2016 13:19   Ct Abdomen Pelvis W Contrast  Result Date: 04/21/2016 CLINICAL DATA:  Shortness of breath, chest pain and abdominal pain. EXAM: CT ANGIOGRAPHY CHEST CT ABDOMEN AND PELVIS WITH CONTRAST TECHNIQUE: Multidetector CT imaging of the chest was performed using the standard protocol during bolus administration of intravenous contrast. Multiplanar CT image reconstructions and MIPs were obtained  to evaluate the vascular anatomy. Multidetector CT imaging of the abdomen and pelvis was performed using the standard protocol during bolus administration of intravenous contrast. CONTRAST:  80 mL Isovue 370 IV COMPARISON:  Prior CT of the abdomen and pelvis at [facility] on 11/02/2012 FINDINGS: CTA CHEST FINDINGS Cardiovascular: Pulmonary arterial opacification is somewhat limited in the peripheral lungs. No pulmonary embolism is identified. The thoracic aorta demonstrates calcified plaque without aneurysmal disease. The heart size is normal. No pericardial fluid identified. Probable calcified plaque in the distribution of the left circumflex coronary artery and also potentially the right coronary artery. Mediastinum/Nodes: No evidence of mediastinal, hilar or axillary lymphadenopathy. No mediastinal masses or fluid collections. The thyroid gland appears unremarkable. Lungs/Pleura: Patchy alveolar airspace disease is noted in the right upper lobe, right middle lobe, right lower lobe and left lower lobe. The most significant involvement is in the right lower lobe. Findings are consistent with pneumonia and may reflect aspiration. No associated pulmonary edema or pleural effusions. No pneumothorax identified. There is a component of bronchial obstruction, likely  reflecting mucous plug formation in the medial and posterior right lower lobe. Musculoskeletal: Spondylosis of the thoracic spine. No bony lesions or fractures identified. Review of the MIP images confirms the above findings. CT ABDOMEN and PELVIS FINDINGS Hepatobiliary: Stable hepatic cysts. The largest remains at the dome of the liver measures approximately 2.4 cm. No evidence of hepatic masses or biliary obstruction. The gallbladder is unremarkable. Pancreas: Unremarkable. No pancreatic ductal dilatation or surrounding inflammatory changes. Spleen: Normal in size without focal abnormality. Adrenals/Urinary Tract: Adrenal glands are unremarkable. Kidneys are normal, without renal calculi, focal lesion, or hydronephrosis. Bladder is unremarkable and decompressed. Stomach/Bowel: There is a small hiatal hernia. The stomach contains a moderate amount of fluid. There is massive gaseous dilatation of much of the colon with maximal colonic diameter of approximately 11.8 cm. As the colon is followed by CT, there is a transition at the level of the sigmoid colon with decompression of the distal sigmoid and rectum. There is a swirling appearance of the proximal sigmoid and adjacent vessels within the mesentery and findings are suspicious for a sigmoid volvulus. No associated free air or pneumatosis identified. The small bowel is decompressed. No abscess is identified. Vascular/Lymphatic: No enlarged lymph nodes are seen. The abdominal aorta is calcified and without evidence of aneurysm. Reproductive: Unremarkable. Other: Small left inguinal hernia contains a short segment of nondilated small bowel. There is no evidence to suggest incarceration. Musculoskeletal: Bony structures show spondylosis of the lumbar spine. Review of the MIP images confirms the above findings. IMPRESSION: 1. Evidence of bilateral pneumonia, right greater than left. Findings may reflect aspiration. 2. Coronary and aortic atherosclerosis. No evidence  of aortic aneurysm. 3. Findings by CT concerning for sigmoid volvulus with significant gaseous distention of the colon proximal to a level of relative beaking of the sigmoid colon and decompression of the sigmoid and rectum distal to the transition point. Recommend surgical and gastroenterology consultation. 4. These results will be called to the ordering clinician or representative by the Radiologist Assistant, and communication documented in the PACS or zVision Dashboard. Electronically Signed   By: Irish Lack M.D.   On: 04/21/2016 13:19   Dg Chest Portable 1 View  Result Date: 04/20/2016 CLINICAL DATA:  Dyspnea since this evening. History of dementia, infarct, hypertension and pacemaker all. EXAM: PORTABLE CHEST 1 VIEW COMPARISON:  12/25/2015. FINDINGS: Cardiomegaly with tortuous ectatic appearing thoracic aorta. Right ventricular pacing wire is noted with  left-sided pacemaker apparatus. Skin fold artifact along the periphery of the right hemithorax. No pneumonic consolidation, CHF, pneumothorax nor effusion. Moderate colonic distention below the diaphragm up to 8 cm possibly representing pseudo-obstruction. No suspicious osseous abnormalities. IMPRESSION: No acute cardiopulmonary disease. Cardiomegaly. Skin fold artifact along the periphery of the right thorax. Electronically Signed   By: Tollie Eth M.D.   On: 04/20/2016 22:07    ASSESSMENT AND PLAN:     1. New onset afib/aflutter - Rate mostly controlled however intermittently goes up. Likely due to acute infection. CHADSVASCs score of 5 (age, HTN and Stroke) from limited information.   2. Bradycardia status post single chamber Medtronic permanent pacemaker  3. Chest pain - Likely rate related. Cycle enzyme.     SignedManson Passey, PA 04/21/2016, 3:04 PM Pager [phone]  Patient seen and examined and history reviewed. Agree with above findings and plan. Patient seen post sigmoidoscopy. Very sedated. Wife of 8 years provided  some history. She reports he was in assisted living for over a year since she had her own health problems and couldn't care for him. Recently he returned home with her. No apparent follow up with Cardiology in last year. Last pacer check in August 2016. She is not aware of any history of AFib or flutter but states he has had an irregular pulse in the past. Has been on ASA and Plavix with history of CVA and TIA. He has chronic dementia. No history of bleeding or falls. Now admitted with sigmoid volvulus s/p decompression. Also evidence of possible aspiration PNA.  On exam he is a thin, frail BM sedated post procedure. No JVD Lungs are clear anteriorly CV IRR without gallop or murmur. No edema. Pulses are OK  Ecg and telemetry reviewed. He has atypical atrial flutter with controlled rate and intermittent pacing. Telemetry shows some Afib as well.  Impression:  1. Atrial flutter- atypical and atrial fibrillation. Duration is unknown. I suspect this is chronic. [place] Vasc score of 5.  2. S/p pacemaker implant for bradycardia- Metronic 3. HTN 4. Sigmoid volvulus.  Plan: will interrogate pacemaker in am. Depending on device we may be able to tell how long he has been in Afib/flutter. His rate is currently well controlled on diltiazem so I would continue with rate control only. Will assess cardiac function with Echo. Need to consider anticoagulant therapy with high [place] vasc score. Would ask Case management to see about affordability of one of the NOACs. Given difficulty in getting to physician appts this would be preferable to coumadin. If NOAC started would stop ASA and Plavix. Given age and renal function would ask pharmacy to dose.   Edwin Wilson, MDFACC 04/21/2016 5:47 PM

## 2016-04-21 NOTE — Progress Notes (Signed)
   04/21/16 0235  Vitals  Temp 97.4 F (36.3 C)  Temp Source Oral  BP (!) 164/91  BP Location Right Arm  BP Method Automatic  Patient Position (if appropriate) Lying  Pulse Rate 97  Pulse Rate Source Dinamap  Resp 20  Oxygen Therapy  SpO2 96 %  O2 Device Room Air  Height and Weight  Weight 93.2 kg (205 lb 7.5 oz)  Type of Scale Used Bed    Pt had a large coffee ground emesis upon admission. Pt denies pain. No SOB. Txpaged [facility] Ad[facility] for pt arrival. Will continue to monitor closely.

## 2016-04-21 NOTE — Consult Note (Signed)
[facility] Consult: 2:00 PM 04/21/2016  LOS: 0 days    Referring Provider: Dr Konrad Dolores  Primary Care Physician:  Patria Mane, MD Primary Gastroenterologist:  Gentry Fitz.      Reason for Consultation:  Sigmoid volvulus.     HPI: Edwin Wilson is a 82 y.o. male.  SNF resident until he went home to live with ?? (family??) 1 week ago. Entire list of medical problems garnered from list in a note from 2015.  Alxheimer's dementia.  Seizure disorder. A fib.  S/p pacemaker.  Cardiomegaly.  ASPVD.  Macular degeneration GERD.  Nutritional optic neuropathy.  ETOH abuse in past.  Anemia.  Thrombocytopenia. Colon diverticulosis.  Internal hemorrhoids. Previous colonoscopy but not details.   meds include Plavix, 81 ASA, Ranitidine 150 daily.   Transported to ED with c/o SOB, diaphoresis, chest pain, intermittent abd pain.  Family not answering listed phone numbers so hx is limited.  Single episode hematemesis in ED.  Distended abdomen, arrhytmia on exam.  CT scan shows PNA, probable sigmoid volvulus with massive dilatation of most of colon. Hepatic cysts.  Small HH.      Past Medical History:  Diagnosis Date  . Atrial fibrillation (HCC)   . BPV (benign positional vertigo)   . Bradycardia   . Dementia   . High cholesterol   . Hypertension   . Seizures (HCC)   . Stroke [facility])     Past Surgical History:  Procedure Laterality Date  . PACEMAKER INSERTION      Prior to Admission medications   Medication Sig Start Date End Date Taking? Authorizing Provider  amLODipine (NORVASC) 10 MG tablet Take 10 mg by mouth daily. 02/21/16 08/19/16 Yes Historical Provider, MD  aspirin EC 81 MG tablet Take 81 mg by mouth. 12/30/09  Yes Historical Provider, MD  atorvastatin (LIPITOR) 80 MG tablet Take 80 mg by mouth daily.   Yes  Historical Provider, MD  clopidogrel (PLAVIX) 75 MG tablet Take 75 mg by mouth daily. 03/20/16  Yes Historical Provider, MD  donepezil (ARICEPT) 10 MG tablet Take 10 mg by mouth at bedtime.  11/14/14  Yes Historical Provider, MD  dorzolamide-timolol (COSOPT) 22.3-6.8 MG/ML ophthalmic solution Place 1 drop into both eyes 2 (two) times daily.   Yes Historical Provider, MD  latanoprost (XALATAN) 0.005 % ophthalmic solution Place 1 drop into both eyes at bedtime.    Yes Historical Provider, MD  levETIRAcetam (KEPPRA) 500 MG tablet Take 500 mg by mouth. 11/14/14  Yes Historical Provider, MD  lisinopril (PRINIVIL,ZESTRIL) 40 MG tablet Take 40 mg by mouth daily. 03/20/16  Yes Historical Provider, MD  loratadine (CLARITIN) 10 MG tablet Take 10 mg by mouth daily. 09/19/15 09/18/16 Yes Historical Provider, MD  Magnesium Oxide 250 MG TABS TAKE 1 TABLET BY MOUTH DAILY AS DIRECTED 05/18/14  Yes Historical Provider, MD  Multiple Vitamin (MULTI-VITAMINS) TABS Take 1 tablet by mouth daily.    Yes Historical Provider, MD  Omega-3 1000 MG CAPS Take 2 capsules by mouth daily.    Yes Historical Provider, MD  potassium chloride SA (K-DUR,KLOR-CON)  20 MEQ tablet TAKE 2 TABLETS BY MOUTH TWICE DAILY WITH MEALS 11/22/14  Yes Historical Provider, MD  ranitidine (ZANTAC) 150 MG tablet Take 150 mg by mouth. 10/26/14 04/20/16 Yes Historical Provider, MD  ranitidine (ZANTAC) 150 MG tablet Take 150 mg by mouth 2 (two) times daily.   Yes Historical Provider, MD  bimatoprost (LUMIGAN) 0.01 % SOLN Administer 1 drop to both eyes nightly.  10/29/10   Historical Provider, MD  brimonidine-timolol (COMBIGAN) 0.2-0.5 % ophthalmic solution Administer 1 drop to both eyes every twelve (12) hours. Frequency:   Dosage:0.0     Instructions:  Note: 07/29/10   Historical Provider, MD  diltiazem (CARTIA XT) 120 MG 24 hr capsule TAKE ONE CAPSULE BY MOUTH EVERY MORNING 05/28/14   Historical Provider, MD  haloperidol (HALDOL) 5 MG tablet 1/2 to 1 at bedtime as needed  11/14/14   Historical Provider, MD  Incontinence Supply Disposable ([employer] UNDERGARMENT X-ABSORB BELT) MISC Frequency:   Dosage:0     Instructions:  Note:Use as directed for urinary incontinence. 04/26/13   Historical Provider, MD  meclizine (ANTIVERT) 12.5 MG tablet Frequency:   Dosage:0   MG  Instructions:  Note:TAKE 1 TABLET BY MOUTH THREE TIMES DAILY AS NEEDED FOR VERTIGO 04/13/13   Historical Provider, MD  THIAMINE HCL PO Take 1 tablet by mouth daily. Take as prescribed by Ophthalmologist     Historical Provider, MD    Scheduled Meds: . amLODipine  10 mg Oral Daily  . aspirin EC  81 mg Oral Daily  . atorvastatin  80 mg Oral Daily  . bimatoprost  1 drop Both Eyes QHS  . brimonidine  1 drop Both Eyes Q12H   And  . timolol  1 drop Both Eyes BID  . ceFEPime (MAXIPIME) IV  1 g Intravenous Q24H  . clopidogrel  75 mg Oral Daily  . diltiazem  120 mg Oral q morning - 10a  . donepezil  10 mg Oral QHS  . dorzolamide-timolol  1 drop Both Eyes BID  . famotidine  20 mg Oral Daily  . insulin aspart  0-5 Units Subcutaneous QHS  . insulin aspart  0-9 Units Subcutaneous TID WC  . latanoprost  1 drop Both Eyes QHS  . levETIRAcetam  500 mg Oral BID  . lisinopril  40 mg Oral Daily  . potassium chloride SA  40 mEq Oral BID WC  . vancomycin  1,250 mg Intravenous Once  . [START ON 04/22/2016] vancomycin  750 mg Intravenous Q12H   Infusions: . sodium chloride    . heparin    . pantoprozole (PROTONIX) infusion 8 mg/hr (04/21/16 0452)   PRN Meds: acetaminophen **OR** acetaminophen, albuterol, ALPRAZolam, gi cocktail, haloperidol, meclizine, morphine injection, nitroGLYCERIN, ondansetron **OR** ondansetron (ZOFRAN) IV   Allergies as of 04/20/2016  . (No Known Allergies)    Family History  Problem Relation Age of Onset  . Family history unknown: Yes    Social History   Social History  . Marital status: Married    Spouse name: N/A  . Number of children: N/A  . Years of education: N/A    Occupational History  . Not on file.   Social History Main Topics  . Smoking status: Former Games developermoker  . Smokeless tobacco: Never Used  . Alcohol use No  . Drug use: No  . Sexual activity: Not on file   Other Topics Concern  . Not on file   Social History Narrative  . No narrative on file    REVIEW OF  SYSTEMS: Unable to perform ROS due to patient's inability to communicate    PHYSICAL EXAM: Vital signs in last 24 hours: Vitals:   04/21/16 0235 04/21/16 0610  BP: (!) 164/91 (!) 149/96  Pulse: 97   Resp: 20   Temp: 97.4 F (36.3 C)    Wt Readings from Last 3 Encounters:  04/21/16 93.2 kg (205 lb 7.5 oz)    Vital signs in last 24 hours: Temp:  [96.2 F (35.7 C)-98.1 F (36.7 C)] 97.4 F (36.3 C) (10/03 0235) Pulse Rate:  [30-104] 45 (10/03 1456) Resp:  [16-28] 21 (10/03 1456) BP: (118-186)/(78-116) 149/96 (10/03 0610) SpO2:  [78 %-100 %] 96 % (10/03 1456) Weight:  [158 lb (71.7 kg)-205 lb 7.5 oz (93.2 kg)] 205 lb 7.5 oz (93.2 kg) (10/03 0235) Last BM Date: 04/21/16  General:  Cachetic appearing elderly Eyes:  anicteric. Lungs: B/l rhonchi, decreased breath sounds at bases Heart:              S1S2, no rubs, murmurs, gallops. Abdomen:  Firm distended tympanic, mildly tender  Neuro:  Alert, oriented to name Psych:  appropriate mood and  Affect.  Intake/Output from previous day: 10/02 0701 - 10/03 0700 In: 503.3 [I.V.:3.3; IV Piggyback:500] Out: 210 [Urine:210] Intake/Output this shift: No intake/output data recorded.  LAB RESULTS:  Recent Labs  04/20/16 2110 04/21/16 0433 04/21/16 0910  WBC 7.0 6.4 6.6  HGB 14.0 14.7 15.8  HCT 41.7 45.2 47.3  PLT 212 211 198   BMET Lab Results  Component Value Date   NA 137 04/20/2016   K 3.9 04/20/2016   CL 108 04/20/2016   CO2 19 (L) 04/20/2016   GLUCOSE 210 (H) 04/20/2016   BUN 20 04/20/2016   CREATININE 1.52 (H) 04/20/2016   CALCIUM 10.5 (H) 04/20/2016   LFT  Recent Labs  04/20/16 2110  PROT  8.9*  ALBUMIN 4.0  AST 27  ALT 25  ALKPHOS 77  BILITOT 0.6   PT/INR Lab Results  Component Value Date   INR 1.28 04/21/2016   Hepatitis Panel No results for input(s): HEPBSAG, HCVAB, HEPAIGM, HEPBIGM in the last 72 hours. C-Diff No components found for: CDIFF Lipase  No results found for: LIPASE  Drugs of Abuse  No results found for: LABOPIA, COCAINSCRNUR, LABBENZ, AMPHETMU, THCU, LABBARB   RADIOLOGY STUDIES: Ct Head Wo Contrast  Result Date: 04/20/2016 CLINICAL DATA:  Altered mental status. Dementia. Chest pain, cough, and shortness of breath. EXAM: CT HEAD WITHOUT CONTRAST TECHNIQUE: Contiguous axial images were obtained from the base of the skull through the vertex without intravenous contrast. COMPARISON:  None. FINDINGS: Brain: Prominent diffuse cerebral atrophy. Ventricular dilatation consistent with central atrophy. Low-attenuation changes in the deep white matter consistent small vessel ischemia. No mass effect or midline shift. No abnormal extra-axial fluid collections. Gray-white matter junctions are distinct. Basal cisterns are not effaced. No evidence of acute intracranial hemorrhage. Vascular: Atherosclerotic vascular calcifications are present. Skull: Normal. Negative for fracture or focal lesion. Sinuses/Orbits: There is an air-fluid level in the right maxillary antrum. This may indicate sinusitis. Paranasal sinuses and mastoid air cells are otherwise clear. Other: None. IMPRESSION: No acute intracranial abnormalities. Prominent cerebral atrophy and small vessel ischemic changes. Air-fluid level in the right maxillary antrum may indicate sinus infection. Electronically Signed   By: Burman Nieves M.D.   On: 04/20/2016 23:06   Ct Angio Chest Pe W Or Wo Contrast  Result Date: 04/21/2016 CLINICAL DATA:  Shortness of breath, chest pain and abdominal pain.  EXAM: CT ANGIOGRAPHY CHEST CT ABDOMEN AND PELVIS WITH CONTRAST TECHNIQUE: Multidetector CT imaging of the chest was  performed using the standard protocol during bolus administration of intravenous contrast. Multiplanar CT image reconstructions and MIPs were obtained to evaluate the vascular anatomy. Multidetector CT imaging of the abdomen and pelvis was performed using the standard protocol during bolus administration of intravenous contrast. CONTRAST:  80 mL Isovue 370 IV COMPARISON:  Prior CT of the abdomen and pelvis at [facility] on 11/02/2012 FINDINGS: CTA CHEST FINDINGS Cardiovascular: Pulmonary arterial opacification is somewhat limited in the peripheral lungs. No pulmonary embolism is identified. The thoracic aorta demonstrates calcified plaque without aneurysmal disease. The heart size is normal. No pericardial fluid identified. Probable calcified plaque in the distribution of the left circumflex coronary artery and also potentially the right coronary artery. Mediastinum/Nodes: No evidence of mediastinal, hilar or axillary lymphadenopathy. No mediastinal masses or fluid collections. The thyroid gland appears unremarkable. Lungs/Pleura: Patchy alveolar airspace disease is noted in the right upper lobe, right middle lobe, right lower lobe and left lower lobe. The most significant involvement is in the right lower lobe. Findings are consistent with pneumonia and may reflect aspiration. No associated pulmonary edema or pleural effusions. No pneumothorax identified. There is a component of bronchial obstruction, likely reflecting mucous plug formation in the medial and posterior right lower lobe. Musculoskeletal: Spondylosis of the thoracic spine. No bony lesions or fractures identified. Review of the MIP images confirms the above findings. CT ABDOMEN and PELVIS FINDINGS Hepatobiliary: Stable hepatic cysts. The largest remains at the dome of the liver measures approximately 2.4 cm. No evidence of hepatic masses or biliary obstruction. The gallbladder is unremarkable. Pancreas: Unremarkable. No pancreatic ductal  dilatation or surrounding inflammatory changes. Spleen: Normal in size without focal abnormality. Adrenals/Urinary Tract: Adrenal glands are unremarkable. Kidneys are normal, without renal calculi, focal lesion, or hydronephrosis. Bladder is unremarkable and decompressed. Stomach/Bowel: There is a small hiatal hernia. The stomach contains a moderate amount of fluid. There is massive gaseous dilatation of much of the colon with maximal colonic diameter of approximately 11.8 cm. As the colon is followed by CT, there is a transition at the level of the sigmoid colon with decompression of the distal sigmoid and rectum. There is a swirling appearance of the proximal sigmoid and adjacent vessels within the mesentery and findings are suspicious for a sigmoid volvulus. No associated free air or pneumatosis identified. The small bowel is decompressed. No abscess is identified. Vascular/Lymphatic: No enlarged lymph nodes are seen. The abdominal aorta is calcified and without evidence of aneurysm. Reproductive: Unremarkable. Other: Small left inguinal hernia contains a short segment of nondilated small bowel. There is no evidence to suggest incarceration. Musculoskeletal: Bony structures show spondylosis of the lumbar spine. Review of the MIP images confirms the above findings. IMPRESSION: 1. Evidence of bilateral pneumonia, right greater than left. Findings may reflect aspiration. 2. Coronary and aortic atherosclerosis. No evidence of aortic aneurysm. 3. Findings by CT concerning for sigmoid volvulus with significant gaseous distention of the colon proximal to a level of relative beaking of the sigmoid colon and decompression of the sigmoid and rectum distal to the transition point. Recommend surgical and gastroenterology consultation. 4. These results will be called to the ordering clinician or representative by the Radiologist Assistant, and communication documented in the PACS or zVision Dashboard. Electronically Signed    By: Irish Lack M.D.   On: 04/21/2016 13:19   Ct Abdomen Pelvis W Contrast  Result Date: 04/21/2016  CLINICAL DATA:  Shortness of breath, chest pain and abdominal pain. EXAM: CT ANGIOGRAPHY CHEST CT ABDOMEN AND PELVIS WITH CONTRAST TECHNIQUE: Multidetector CT imaging of the chest was performed using the standard protocol during bolus administration of intravenous contrast. Multiplanar CT image reconstructions and MIPs were obtained to evaluate the vascular anatomy. Multidetector CT imaging of the abdomen and pelvis was performed using the standard protocol during bolus administration of intravenous contrast. CONTRAST:  80 mL Isovue 370 IV COMPARISON:  Prior CT of the abdomen and pelvis at [facility] on 11/02/2012 FINDINGS: CTA CHEST FINDINGS Cardiovascular: Pulmonary arterial opacification is somewhat limited in the peripheral lungs. No pulmonary embolism is identified. The thoracic aorta demonstrates calcified plaque without aneurysmal disease. The heart size is normal. No pericardial fluid identified. Probable calcified plaque in the distribution of the left circumflex coronary artery and also potentially the right coronary artery. Mediastinum/Nodes: No evidence of mediastinal, hilar or axillary lymphadenopathy. No mediastinal masses or fluid collections. The thyroid gland appears unremarkable. Lungs/Pleura: Patchy alveolar airspace disease is noted in the right upper lobe, right middle lobe, right lower lobe and left lower lobe. The most significant involvement is in the right lower lobe. Findings are consistent with pneumonia and may reflect aspiration. No associated pulmonary edema or pleural effusions. No pneumothorax identified. There is a component of bronchial obstruction, likely reflecting mucous plug formation in the medial and posterior right lower lobe. Musculoskeletal: Spondylosis of the thoracic spine. No bony lesions or fractures identified. Review of the MIP images confirms the above  findings. CT ABDOMEN and PELVIS FINDINGS Hepatobiliary: Stable hepatic cysts. The largest remains at the dome of the liver measures approximately 2.4 cm. No evidence of hepatic masses or biliary obstruction. The gallbladder is unremarkable. Pancreas: Unremarkable. No pancreatic ductal dilatation or surrounding inflammatory changes. Spleen: Normal in size without focal abnormality. Adrenals/Urinary Tract: Adrenal glands are unremarkable. Kidneys are normal, without renal calculi, focal lesion, or hydronephrosis. Bladder is unremarkable and decompressed. Stomach/Bowel: There is a small hiatal hernia. The stomach contains a moderate amount of fluid. There is massive gaseous dilatation of much of the colon with maximal colonic diameter of approximately 11.8 cm. As the colon is followed by CT, there is a transition at the level of the sigmoid colon with decompression of the distal sigmoid and rectum. There is a swirling appearance of the proximal sigmoid and adjacent vessels within the mesentery and findings are suspicious for a sigmoid volvulus. No associated free air or pneumatosis identified. The small bowel is decompressed. No abscess is identified. Vascular/Lymphatic: No enlarged lymph nodes are seen. The abdominal aorta is calcified and without evidence of aneurysm. Reproductive: Unremarkable. Other: Small left inguinal hernia contains a short segment of nondilated small bowel. There is no evidence to suggest incarceration. Musculoskeletal: Bony structures show spondylosis of the lumbar spine. Review of the MIP images confirms the above findings. IMPRESSION: 1. Evidence of bilateral pneumonia, right greater than left. Findings may reflect aspiration. 2. Coronary and aortic atherosclerosis. No evidence of aortic aneurysm. 3. Findings by CT concerning for sigmoid volvulus with significant gaseous distention of the colon proximal to a level of relative beaking of the sigmoid colon and decompression of the sigmoid and  rectum distal to the transition point. Recommend surgical and gastroenterology consultation. 4. These results will be called to the ordering clinician or representative by the Radiologist Assistant, and communication documented in the PACS or zVision Dashboard. Electronically Signed   By: Irish Lack M.D.   On: 04/21/2016 13:19  Dg Chest Portable 1 View  Result Date: 04/20/2016 CLINICAL DATA:  Dyspnea since this evening. History of dementia, infarct, hypertension and pacemaker all. EXAM: PORTABLE CHEST 1 VIEW COMPARISON:  12/25/2015. FINDINGS: Cardiomegaly with tortuous ectatic appearing thoracic aorta. Right ventricular pacing wire is noted with left-sided pacemaker apparatus. Skin fold artifact along the periphery of the right hemithorax. No pneumonic consolidation, CHF, pneumothorax nor effusion. Moderate colonic distention below the diaphragm up to 8 cm possibly representing pseudo-obstruction. No suspicious osseous abnormalities. IMPRESSION: No acute cardiopulmonary disease. Cardiomegaly. Skin fold artifact along the periphery of the right thorax. Electronically Signed   By: Tollie Eth M.D.   On: 04/20/2016 22:07     IMPRESSION:   *  Sigmoid volvulus.  *  bil HCAP.  ? Due to aspiration.  C/o SOB.  Cardiac enzymes normal x 3.    *  Advanced dementia.      PLAN:     Plan for sigmoidoscopy for decompression   Jennye Moccasin  04/21/2016, 2:00 PM Pager: [phone]   Attending physician's note   I have taken a history, examined the patient and reviewed the chart. I agree with the Advanced Practitioner's note, impression and recommendations. 61 yr M with findings of sigmoid volvulus, will plan for emergent flex sig for decompression.   Iona Beard, MD [phone] Mon-Fri 8a-5p [phone] after 5p, weekends, holidays

## 2016-04-21 NOTE — Progress Notes (Addendum)
This is a no charge note  [facility] per Dr. Fredderick PhenixBelfi.  82 year old man with past medical history of dementia, stroke, hypertension, hyperlipidemia, GERD, pacemaker placement, who presents with SOB,sweating, mild chest pain, and mild cough, questionable abdominal pain. No nausea, vomiting, diarrhea. Patient was found to have AKI with creatinine 1.52, WBC 7.0, temperature 96.2, tachycardia, mildly tachycardia, oxygen desaturation to 78% on room air, negative urinalysis, negative troponin, negative chest x-ray for acute abnormalities. CT head is negative for acute intracranial abnormalities. EKG showed possible flutter. Pt does not have history of atrial fibrillation or flutter on medical problem list, but the patient is on Cardizem. Pt is accepted to tele bed for obs.  Addendum: RN report that pt had large coffee ground emesis, but no AP-->d/c ASA, started IV protonic gtt, check INR/PTT, type/ screen and cbc q6h  Lorretta HarpXilin Rosalia Mcavoy, MD  [facility] Pager [phone]  If 7PM-7AM, please contact night-coverage [email or web address] Password [email or web address] 04/21/2016, 12:23 AM

## 2016-04-21 NOTE — ED Notes (Signed)
Attempted report with RN on 2W. RN unavailable. Phone number left for call back.

## 2016-04-21 NOTE — Progress Notes (Signed)
PT Cancellation Note  Patient Details Name: Samule DryJessie Fales MRN: [id number] DOB: 06-Dec-1929   Cancelled Treatment:    Reason Eval/Treat Not Completed: Patient not medically ready. Pt with possible sigmoid volvulus and for GI and surgery consults. Will defer eval today and follow up tomorrow.   Simone Tuckey 04/21/2016, 1:51 PM [employer] Corporation[employer]ary Kavari Parrillo PT [phone]

## 2016-04-21 NOTE — Progress Notes (Addendum)
CT results noted  Will start Cef/Vanc due to likely HCAP - Pneumo orders  Speech Eval due to possible aspiration   Gen Surgery and GI  consulted given sigmoid volvulus on CT.   Shelly Flattenavid Merrell, MD [facility] 04/21/2016, 1:40 PM

## 2016-04-21 NOTE — Progress Notes (Signed)
Pharmacy Antibiotic Note  Samule DryJessie Eye is a 82 y.o. male admitted on 04/20/2016 with pneumonia.  Pharmacy has been consulted for vancomycin and cefepime dosing. CT of the chest consistent with pneumonia with possible aspiration.  Plan: Vancomycin 750 IV every 12 hours.  Goal trough 15-20 mcg/mL. The dose of cefepime will be adjusted to 1g daily based on renal function.  Height: 6\' 2"  (188 cm) Weight: 205 lb 7.5 oz (93.2 kg) IBW/kg (Calculated) : 82.2  Temp (24hrs), Avg:97.5 F (36.4 C), Min:96.2 F (35.7 C), Max:98.1 F (36.7 C)   Recent Labs Lab 04/20/16 2110 04/21/16 0433 04/21/16 0910  WBC 7.0 6.4 6.6  CREATININE 1.52*  --   --     Estimated Creatinine Clearance: 42.8 mL/min (by C-G formula based on SCr of 1.52 mg/dL (H)).    No Known Allergies  Antimicrobials this admission: Vanc 10/3>> Cefepime 10/3>>  Dose adjustments this admission:   Microbiology results:  Thank you for allowing pharmacy to be a part of this patient's care.  Sheppard CoilFrank Wilson PharmD., BCPS Clinical Pharmacist Pager [phone] 04/21/2016 1:55 PM

## 2016-04-21 NOTE — Progress Notes (Signed)
ANTICOAGULATION CONSULT NOTE - Initial Consult  Pharmacy Consult for heparin Indication: atrial fibrillation, r/o acs  Assessment: 82 year old male with noted atrial flutter which is new. Orders received to start IV heparin now for afib and currently being ruled out for possible acs event. Cardiac enzymes are negative.   One episode of coffee ground emesis noted upon admission in nursing note but no further mention. Hgb normal at 14 on admission and he was started on protonix infusion. Will hold off on heparin bolus given this report but bleed unlikely given rising hemoglobin. INR 1.28 on admit. Not on anticoagulants.  Goal of Therapy:  Heparin level 0.3-0.7 units/ml Monitor platelets by anticoagulation protocol: Yes   Plan:  Start heparin infusion at 900 units/hr Check anti-Xa level in 8 hours and daily while on heparin Continue to monitor H&H and platelets  No Known Allergies  Patient Measurements: Height: 6\' 2"  (188 cm) Weight: 205 lb 7.5 oz (93.2 kg) IBW/kg (Calculated) : 82.2 Heparin Dosing Weight: 72kg  Vital Signs: Temp: 97.4 F (36.3 C) (10/03 0235) Temp Source: Oral (10/03 0341) BP: 149/96 (10/03 0610) Pulse Rate: 97 (10/03 0235)  Labs:  Recent Labs  04/20/16 2110 04/21/16 0433 04/21/16 0910  HGB 14.0 14.7 15.8  HCT 41.7 45.2 47.3  PLT 212 211 198  APTT  --  33  --   LABPROT  --  16.1*  --   INR  --  1.28  --   CREATININE 1.52*  --   --   TROPONINI <0.03 <0.03 <0.03    Estimated Creatinine Clearance: 42.8 mL/min (by C-G formula based on SCr of 1.52 mg/dL (H)).   Medical History: Past Medical History:  Diagnosis Date  . Atrial fibrillation (HCC)   . BPV (benign positional vertigo)   . Bradycardia   . Dementia   . High cholesterol   . Hypertension   . Seizures (HCC)   . Stroke [facility](HCC)     Sheppard CoilFrank Wilson PharmD., BCPS Clinical Pharmacist Pager [phone] 04/21/2016 11:52 AM

## 2016-04-22 ENCOUNTER — Inpatient Hospital Stay (HOSPITAL_COMMUNITY): Payer: Medicare Other

## 2016-04-22 ENCOUNTER — Encounter (HOSPITAL_COMMUNITY): Payer: Self-pay | Admitting: Gastroenterology

## 2016-04-22 DIAGNOSIS — G308 Other Alzheimer's disease: Secondary | ICD-10-CM

## 2016-04-22 DIAGNOSIS — Z950 Presence of cardiac pacemaker: Secondary | ICD-10-CM

## 2016-04-22 DIAGNOSIS — F0281 Dementia in other diseases classified elsewhere with behavioral disturbance: Secondary | ICD-10-CM

## 2016-04-22 DIAGNOSIS — I484 Atypical atrial flutter: Secondary | ICD-10-CM

## 2016-04-22 DIAGNOSIS — I4891 Unspecified atrial fibrillation: Secondary | ICD-10-CM

## 2016-04-22 DIAGNOSIS — I10 Essential (primary) hypertension: Secondary | ICD-10-CM

## 2016-04-22 DIAGNOSIS — K562 Volvulus: Principal | ICD-10-CM

## 2016-04-22 LAB — COMPREHENSIVE METABOLIC PANEL
ALT: 22 U/L (ref 17–63)
AST: 29 U/L (ref 15–41)
Albumin: 2.8 g/dL — ABNORMAL LOW (ref 3.5–5.0)
Alkaline Phosphatase: 52 U/L (ref 38–126)
Anion gap: 11 (ref 5–15)
BUN: 23 mg/dL — ABNORMAL HIGH (ref 6–20)
CHLORIDE: 109 mmol/L (ref 101–111)
CO2: 21 mmol/L — AB (ref 22–32)
CREATININE: 1.34 mg/dL — AB (ref 0.61–1.24)
Calcium: 9.7 mg/dL (ref 8.9–10.3)
GFR calc non Af Amer: 47 mL/min — ABNORMAL LOW (ref >60)
GFR, EST AFRICAN AMERICAN: 55 mL/min — AB (ref >60)
Glucose, Bld: 100 mg/dL — ABNORMAL HIGH (ref 65–99)
POTASSIUM: 4.4 mmol/L (ref 3.5–5.1)
SODIUM: 141 mmol/L (ref 135–145)
Total Bilirubin: 1.1 mg/dL (ref 0.3–1.2)
Total Protein: 6.9 g/dL (ref 6.5–8.1)

## 2016-04-22 LAB — GLUCOSE, CAPILLARY
GLUCOSE-CAPILLARY: 87 mg/dL (ref 65–99)
GLUCOSE-CAPILLARY: 84 mg/dL (ref 65–99)
Glucose-Capillary: 106 mg/dL — ABNORMAL HIGH (ref 65–99)
Glucose-Capillary: 117 mg/dL — ABNORMAL HIGH (ref 65–99)

## 2016-04-22 LAB — CBC
HCT: 39.9 % (ref 39.0–52.0)
Hemoglobin: 13.1 g/dL (ref 13.0–17.0)
MCH: 30.4 pg (ref 26.0–34.0)
MCHC: 32.8 g/dL (ref 30.0–36.0)
MCV: 92.6 fL (ref 78.0–100.0)
PLATELETS: 177 10*3/uL (ref 150–400)
RBC: 4.31 MIL/uL (ref 4.22–5.81)
RDW: 14.8 % (ref 11.5–15.5)
WBC: 14.7 10*3/uL — AB (ref 4.0–10.5)

## 2016-04-22 LAB — HEMOGLOBIN A1C
Hgb A1c MFr Bld: 5.8 % — ABNORMAL HIGH (ref 4.8–5.6)
MEAN PLASMA GLUCOSE: 120 mg/dL

## 2016-04-22 LAB — HIV ANTIBODY (ROUTINE TESTING W REFLEX): HIV Screen 4th Generation wRfx: NONREACTIVE

## 2016-04-22 MED ORDER — CARVEDILOL 25 MG PO TABS
25.0000 mg | ORAL_TABLET | Freq: Two times a day (BID) | ORAL | Status: DC
  Administered 2016-04-22 – 2016-04-24 (×5): 25 mg via ORAL
  Filled 2016-04-22 (×5): qty 1

## 2016-04-22 MED ORDER — HALOPERIDOL 1 MG PO TABS
1.0000 mg | ORAL_TABLET | Freq: Every evening | ORAL | Status: DC | PRN
  Filled 2016-04-22: qty 1

## 2016-04-22 MED ORDER — FAMOTIDINE 20 MG PO TABS
20.0000 mg | ORAL_TABLET | Freq: Two times a day (BID) | ORAL | Status: DC
  Administered 2016-04-22 – 2016-04-24 (×5): 20 mg via ORAL
  Filled 2016-04-22 (×5): qty 1

## 2016-04-22 NOTE — Progress Notes (Signed)
Daily Rounding Note  04/22/2016, 8:17 AM  LOS: 1 day   SUBJECTIVE:   Chief complaint: wet sheets    Pt's condom cath got dislodged and pt wet the bed and is laying in wet sheets.  Trying to get out of bed. Denies abdominal pain and nausea.  No pain.  "I could eat" when asked if hungry.  1 stool recorded over night.  OBJECTIVE:         Vital signs in last 24 hours:    Temp:  [97.3 F (36.3 C)-97.6 F (36.4 C)] 97.6 F (36.4 C) (10/04 0510) Pulse Rate:  [56-82] 78 (10/04 0510) Resp:  [16-21] 20 (10/04 0510) BP: (132-203)/(64-152) 186/86 (10/04 0510) SpO2:  [92 %-100 %] 96 % (10/04 0510) Weight:  [67 kg (147 lb 9.6 oz)] 67 kg (147 lb 9.6 oz) (10/04 0700) Last BM Date: 04/21/16 Filed Weights   04/20/16 2058 04/21/16 0235 04/22/16 0700  Weight: 71.7 kg (158 lb) 93.2 kg (205 lb 7.5 oz) 67 kg (147 lb 9.6 oz)   General: more alert.  Frail.  Other than wet sheets he is comfortable.     Heart: irreg, irreg, rate controlled Chest: clear bil.  Some mucousy vocal quality. Abdomen: soft, NT, BS present.  Extremities: no CCE Neuro/Psych:  Pleasant, cooperative, not anxious.  Easily diverted from trying to get out of bed.  Although appropriate, he is not oriented to year of his birth, place.  He does readily follow commands.   Intake/Output from previous day: 10/03 0701 - 10/04 0700 In: 0  Out: 800 [Urine:800]  Intake/Output this shift: No intake/output data recorded.  Lab Results:  Recent Labs  04/21/16 0910 04/21/16 1420 04/22/16 0039  WBC 6.6 9.3 14.7*  HGB 15.8 15.1 13.1  HCT 47.3 45.5 39.9  PLT 198 184 177   BMET  Recent Labs  04/20/16 2110 04/22/16 0039  NA 137 141  K 3.9 4.4  CL 108 109  CO2 19* 21*  GLUCOSE 210* 100*  BUN 20 23*  CREATININE 1.52* 1.34*  CALCIUM 10.5* 9.7   LFT  Recent Labs  04/20/16 2110 04/22/16 0039  PROT 8.9* 6.9  ALBUMIN 4.0 2.8*  AST 27 29  ALT 25 22  ALKPHOS 77 52   BILITOT 0.6 1.1   PT/INR  Recent Labs  04/21/16 0433 04/21/16 1420  LABPROT 16.1* 16.7*  INR 1.28 1.34   Hepatitis Panel No results for input(s): HEPBSAG, HCVAB, HEPAIGM, HEPBIGM in the last 72 hours.  Studies/Results: Ct Head Wo Contrast  Result Date: 04/20/2016 CLINICAL DATA:  Altered mental status. Dementia. Chest pain, cough, and shortness of breath. EXAM: CT HEAD WITHOUT CONTRAST TECHNIQUE: Contiguous axial images were obtained from the base of the skull through the vertex without intravenous contrast. COMPARISON:  None. FINDINGS: Brain: Prominent diffuse cerebral atrophy. Ventricular dilatation consistent with central atrophy. Low-attenuation changes in the deep white matter consistent small vessel ischemia. No mass effect or midline shift. No abnormal extra-axial fluid collections. Gray-white matter junctions are distinct. Basal cisterns are not effaced. No evidence of acute intracranial hemorrhage. Vascular: Atherosclerotic vascular calcifications are present. Skull: Normal. Negative for fracture or focal lesion. Sinuses/Orbits: There is an air-fluid level in the right maxillary antrum. This may indicate sinusitis. Paranasal sinuses and mastoid air cells are otherwise clear. Other: None. IMPRESSION: No acute intracranial abnormalities. Prominent cerebral atrophy and small vessel ischemic changes. Air-fluid level in the right maxillary antrum may indicate sinus infection. Electronically Signed  By: Burman Nieves M.D.   On: 04/20/2016 23:06   Ct Angio Chest Pe W Or Wo Contrast  Result Date: 04/21/2016 CLINICAL DATA:  Shortness of breath, chest pain and abdominal pain. EXAM: CT ANGIOGRAPHY CHEST CT ABDOMEN AND PELVIS WITH CONTRAST TECHNIQUE: Multidetector CT imaging of the chest was performed using the standard protocol during bolus administration of intravenous contrast. Multiplanar CT image reconstructions and MIPs were obtained to evaluate the vascular anatomy. Multidetector CT  imaging of the abdomen and pelvis was performed using the standard protocol during bolus administration of intravenous contrast. CONTRAST:  80 mL Isovue 370 IV COMPARISON:  Prior CT of the abdomen and pelvis at [facility] on 11/02/2012 FINDINGS: CTA CHEST FINDINGS Cardiovascular: Pulmonary arterial opacification is somewhat limited in the peripheral lungs. No pulmonary embolism is identified. The thoracic aorta demonstrates calcified plaque without aneurysmal disease. The heart size is normal. No pericardial fluid identified. Probable calcified plaque in the distribution of the left circumflex coronary artery and also potentially the right coronary artery. Mediastinum/Nodes: No evidence of mediastinal, hilar or axillary lymphadenopathy. No mediastinal masses or fluid collections. The thyroid gland appears unremarkable. Lungs/Pleura: Patchy alveolar airspace disease is noted in the right upper lobe, right middle lobe, right lower lobe and left lower lobe. The most significant involvement is in the right lower lobe. Findings are consistent with pneumonia and may reflect aspiration. No associated pulmonary edema or pleural effusions. No pneumothorax identified. There is a component of bronchial obstruction, likely reflecting mucous plug formation in the medial and posterior right lower lobe. Musculoskeletal: Spondylosis of the thoracic spine. No bony lesions or fractures identified. Review of the MIP images confirms the above findings. CT ABDOMEN and PELVIS FINDINGS Hepatobiliary: Stable hepatic cysts. The largest remains at the dome of the liver measures approximately 2.4 cm. No evidence of hepatic masses or biliary obstruction. The gallbladder is unremarkable. Pancreas: Unremarkable. No pancreatic ductal dilatation or surrounding inflammatory changes. Spleen: Normal in size without focal abnormality. Adrenals/Urinary Tract: Adrenal glands are unremarkable. Kidneys are normal, without renal calculi, focal  lesion, or hydronephrosis. Bladder is unremarkable and decompressed. Stomach/Bowel: There is a small hiatal hernia. The stomach contains a moderate amount of fluid. There is massive gaseous dilatation of much of the colon with maximal colonic diameter of approximately 11.8 cm. As the colon is followed by CT, there is a transition at the level of the sigmoid colon with decompression of the distal sigmoid and rectum. There is a swirling appearance of the proximal sigmoid and adjacent vessels within the mesentery and findings are suspicious for a sigmoid volvulus. No associated free air or pneumatosis identified. The small bowel is decompressed. No abscess is identified. Vascular/Lymphatic: No enlarged lymph nodes are seen. The abdominal aorta is calcified and without evidence of aneurysm. Reproductive: Unremarkable. Other: Small left inguinal hernia contains a short segment of nondilated small bowel. There is no evidence to suggest incarceration. Musculoskeletal: Bony structures show spondylosis of the lumbar spine. Review of the MIP images confirms the above findings. IMPRESSION: 1. Evidence of bilateral pneumonia, right greater than left. Findings may reflect aspiration. 2. Coronary and aortic atherosclerosis. No evidence of aortic aneurysm. 3. Findings by CT concerning for sigmoid volvulus with significant gaseous distention of the colon proximal to a level of relative beaking of the sigmoid colon and decompression of the sigmoid and rectum distal to the transition point. Recommend surgical and gastroenterology consultation. 4. These results will be called to the ordering clinician or representative by the Radiologist Assistant,  and communication documented in the PACS or zVision Dashboard. Electronically Signed   By: Irish Lack M.D.   On: 04/21/2016 13:19   Ct Abdomen Pelvis W Contrast  Result Date: 04/21/2016 CLINICAL DATA:  Shortness of breath, chest pain and abdominal pain. EXAM: CT ANGIOGRAPHY CHEST  CT ABDOMEN AND PELVIS WITH CONTRAST TECHNIQUE: Multidetector CT imaging of the chest was performed using the standard protocol during bolus administration of intravenous contrast. Multiplanar CT image reconstructions and MIPs were obtained to evaluate the vascular anatomy. Multidetector CT imaging of the abdomen and pelvis was performed using the standard protocol during bolus administration of intravenous contrast. CONTRAST:  80 mL Isovue 370 IV COMPARISON:  Prior CT of the abdomen and pelvis at [facility] on 11/02/2012 FINDINGS: CTA CHEST FINDINGS Cardiovascular: Pulmonary arterial opacification is somewhat limited in the peripheral lungs. No pulmonary embolism is identified. The thoracic aorta demonstrates calcified plaque without aneurysmal disease. The heart size is normal. No pericardial fluid identified. Probable calcified plaque in the distribution of the left circumflex coronary artery and also potentially the right coronary artery. Mediastinum/Nodes: No evidence of mediastinal, hilar or axillary lymphadenopathy. No mediastinal masses or fluid collections. The thyroid gland appears unremarkable. Lungs/Pleura: Patchy alveolar airspace disease is noted in the right upper lobe, right middle lobe, right lower lobe and left lower lobe. The most significant involvement is in the right lower lobe. Findings are consistent with pneumonia and may reflect aspiration. No associated pulmonary edema or pleural effusions. No pneumothorax identified. There is a component of bronchial obstruction, likely reflecting mucous plug formation in the medial and posterior right lower lobe. Musculoskeletal: Spondylosis of the thoracic spine. No bony lesions or fractures identified. Review of the MIP images confirms the above findings. CT ABDOMEN and PELVIS FINDINGS Hepatobiliary: Stable hepatic cysts. The largest remains at the dome of the liver measures approximately 2.4 cm. No evidence of hepatic masses or biliary  obstruction. The gallbladder is unremarkable. Pancreas: Unremarkable. No pancreatic ductal dilatation or surrounding inflammatory changes. Spleen: Normal in size without focal abnormality. Adrenals/Urinary Tract: Adrenal glands are unremarkable. Kidneys are normal, without renal calculi, focal lesion, or hydronephrosis. Bladder is unremarkable and decompressed. Stomach/Bowel: There is a small hiatal hernia. The stomach contains a moderate amount of fluid. There is massive gaseous dilatation of much of the colon with maximal colonic diameter of approximately 11.8 cm. As the colon is followed by CT, there is a transition at the level of the sigmoid colon with decompression of the distal sigmoid and rectum. There is a swirling appearance of the proximal sigmoid and adjacent vessels within the mesentery and findings are suspicious for a sigmoid volvulus. No associated free air or pneumatosis identified. The small bowel is decompressed. No abscess is identified. Vascular/Lymphatic: No enlarged lymph nodes are seen. The abdominal aorta is calcified and without evidence of aneurysm. Reproductive: Unremarkable. Other: Small left inguinal hernia contains a short segment of nondilated small bowel. There is no evidence to suggest incarceration. Musculoskeletal: Bony structures show spondylosis of the lumbar spine. Review of the MIP images confirms the above findings. IMPRESSION: 1. Evidence of bilateral pneumonia, right greater than left. Findings may reflect aspiration. 2. Coronary and aortic atherosclerosis. No evidence of aortic aneurysm. 3. Findings by CT concerning for sigmoid volvulus with significant gaseous distention of the colon proximal to a level of relative beaking of the sigmoid colon and decompression of the sigmoid and rectum distal to the transition point. Recommend surgical and gastroenterology consultation. 4. These results will be called  to the ordering clinician or representative by the Radiologist  Assistant, and communication documented in the PACS or zVision Dashboard. Electronically Signed   By: Irish Lack M.D.   On: 04/21/2016 13:19   Dg Chest Portable 1 View  Result Date: 04/20/2016 CLINICAL DATA:  Dyspnea since this evening. History of dementia, infarct, hypertension and pacemaker all. EXAM: PORTABLE CHEST 1 VIEW COMPARISON:  12/25/2015. FINDINGS: Cardiomegaly with tortuous ectatic appearing thoracic aorta. Right ventricular pacing wire is noted with left-sided pacemaker apparatus. Skin fold artifact along the periphery of the right hemithorax. No pneumonic consolidation, CHF, pneumothorax nor effusion. Moderate colonic distention below the diaphragm up to 8 cm possibly representing pseudo-obstruction. No suspicious osseous abnormalities. IMPRESSION: No acute cardiopulmonary disease. Cardiomegaly. Skin fold artifact along the periphery of the right thorax. Electronically Signed   By: Tollie Eth M.D.   On: 04/20/2016 22:07   Scheduled Meds: . amLODipine  10 mg Oral Daily  . aspirin EC  81 mg Oral Daily  . atorvastatin  80 mg Oral Daily  . bimatoprost  1 drop Both Eyes QHS  . brimonidine  1 drop Both Eyes Q12H   And  . timolol  1 drop Both Eyes BID  . carvedilol  25 mg Oral BID WC  . ceFEPime (MAXIPIME) IV  1 g Intravenous Q24H  . clopidogrel  75 mg Oral Daily  . donepezil  10 mg Oral QHS  . dorzolamide-timolol  1 drop Both Eyes BID  . famotidine  20 mg Oral Daily  . insulin aspart  0-5 Units Subcutaneous QHS  . insulin aspart  0-9 Units Subcutaneous TID WC  . latanoprost  1 drop Both Eyes QHS  . levETIRAcetam  500 mg Oral BID  . lisinopril  40 mg Oral Daily  . potassium chloride SA  40 mEq Oral BID WC  . vancomycin  750 mg Intravenous Q12H   Continuous Infusions: . sodium chloride 100 mL/hr at 04/21/16 2210  . pantoprozole (PROTONIX) infusion 8 mg/hr (04/22/16 0524)   PRN Meds:.acetaminophen **OR** acetaminophen, albuterol, ALPRAZolam, gi cocktail, haloperidol,  meclizine, morphine injection, nitroGLYCERIN, ondansetron **OR** ondansetron (ZOFRAN) IV  ASSESMENT:   *  Sigmoid volvulus.  S/p 10/3 flex sig with decompression.  Surgery considers pt poor operative candidate for resection and colostomy but a consideration if volvulus recurrs.  *  Single episode hematemesis reported.  No Gastroccult sent, No recurrence.   *  A flutter.  Cardiology involved. S/p previous pacemaker.   *  Hx CVA.  On Plavix.   *  HCAP.  On Vanc and maxipime  *  IDDM.  *  Dementia.    *  AKI.     PLAN   *  Start dysphagia 3 diet.   *  Stop PPI infusion.  Continue Pepcid BID.  Takes ranitidine 150 BID at home.      Jennye Moccasin  04/22/2016, 8:17 AM Pager: [phone]

## 2016-04-22 NOTE — Evaluation (Signed)
Physical Therapy Evaluation Patient Details Name: Edwin Wilson MRN: [id number] DOB: 15-Jun-1930 Today's Date: 04/22/2016   History of Present Illness  Pt presented with chest/abdomen pain. Pt found to have volvulus and PNA. PMH - dementia, HTN, CVA  Clinical Impression  Pt admitted with above diagnosis and presents to PT with functional limitations due to deficits listed below (See PT problem list). Pt needs skilled PT to maximize independence and safety to allow discharge to home with wife. Pt close to baseline with his mobility.     Follow Up Recommendations No PT follow up    Equipment Recommendations  None recommended by PT    Recommendations for Other Services       Precautions / Restrictions Precautions Precautions: Fall      Mobility  Bed Mobility Overal bed mobility: Needs Assistance Bed Mobility: Supine to Sit     Supine to sit: Supervision     General bed mobility comments: Incr time and effort and supervision for safety  Transfers Overall transfer level: Needs assistance Equipment used: None Transfers: Sit to/from Stand Sit to Stand: Supervision         General transfer comment: for safety  Ambulation/Gait Ambulation/Gait assistance: Min guard Ambulation Distance (Feet): 170 Feet Assistive device: None Gait Pattern/deviations: Step-through pattern;Decreased step length - right;Decreased step length - left;Shuffle;Trunk flexed;Narrow base of support Gait velocity: slow Gait velocity interpretation: <1.8 ft/sec, indicative of risk for recurrent falls General Gait Details: Verbal cues to stand more erect  Stairs            Wheelchair Mobility    Modified Rankin (Stroke Patients Only)       Balance Overall balance assessment: Needs assistance Sitting-balance support: No upper extremity supported;Feet supported Sitting balance-Leahy Scale: Good     Standing balance support: No upper extremity supported Standing balance-Leahy  Scale: Fair                               Pertinent Vitals/Pain Pain Assessment: Faces Faces Pain Scale: No hurt    Home Living Family/patient expects to be discharged to:: Private residence Living Arrangements: Spouse/significant other Available Help at Discharge: Family;Available 24 hours/day Type of Home: House Home Access: Level entry     Home Layout: One level Home Equipment: None      Prior Function Level of Independence: Needs assistance   Gait / Transfers Assistance Needed: amb modified independent without assistive device  ADL's / Homemaking Assistance Needed: aide assist with bathing and dressing        Hand Dominance        Extremity/Trunk Assessment   Upper Extremity Assessment: Defer to OT evaluation           Lower Extremity Assessment: Generalized weakness         Communication   Communication: No difficulties  Cognition Arousal/Alertness: Awake/alert Behavior During Therapy: Flat affect Overall Cognitive Status: History of cognitive impairments - at baseline                      General Comments      Exercises     Assessment/Plan    PT Assessment Patient needs continued PT services  PT Problem List Decreased strength;Decreased balance;Decreased mobility          PT Treatment Interventions DME instruction;Therapeutic exercise;Gait training;Balance training;Functional mobility training;Therapeutic activities;Patient/family education    PT Goals (Current goals can be found in the Care Plan section)  Acute Rehab PT Goals Patient Stated Goal: wife wants pt to return home PT Goal Formulation: With family Time For Goal Achievement: 04/29/16 Potential to Achieve Goals: Good    Frequency Min 3X/week   Barriers to discharge        Co-evaluation               End of Session Equipment Utilized During Treatment: Gait belt Activity Tolerance: Patient tolerated treatment well Patient left: in chair;with  call bell/phone within reach;with chair alarm set;with family/visitor present Nurse Communication: Mobility status         Time: 1344-1410 PT Time Calculation (min) (ACUTE ONLY): 26 min   Charges:   PT Evaluation $PT Eval Moderate Complexity: 1 Procedure PT Treatments $Gait Training: 8-22 mins   PT G Codes:        Paulla Mcclaskey 20-May-2016, 2:32 PM [facility] PT [phone]

## 2016-04-22 NOTE — Progress Notes (Addendum)
TELEMETRY: Reviewed telemetry pt in Atrial fibrillation with controlled rate and intermittent V pacing.: Vitals:   04/21/16 1600 04/21/16 2010 04/22/16 0510 04/22/16 0700  BP: 136/71 (!) 160/80 (!) 186/86   Pulse: 80 82 78   Resp:  20 20   Temp:  97.3 F (36.3 C) 97.6 F (36.4 C)   TempSrc:  Axillary Axillary   SpO2: 93% 97% 96%   Weight:    147 lb 9.6 oz (67 kg)  Height:        Intake/Output Summary (Last 24 hours) at 04/22/16 0838 Last data filed at 04/22/16 9629  Gross per 24 hour  Intake                0 ml  Output              800 ml  Net             -800 ml   Filed Weights   04/20/16 2058 04/21/16 0235 04/22/16 0700  Weight: 158 lb (71.7 kg) 205 lb 7.5 oz (93.2 kg) 147 lb 9.6 oz (67 kg)    Subjective  Incontinent of urine. Complains of some nausea. No chest pain or SOB.   Marland Kitchen amLODipine  10 mg Oral Daily  . aspirin EC  81 mg Oral Daily  . atorvastatin  80 mg Oral Daily  . bimatoprost  1 drop Both Eyes QHS  . brimonidine  1 drop Both Eyes Q12H   And  . timolol  1 drop Both Eyes BID  . ceFEPime (MAXIPIME) IV  1 g Intravenous Q24H  . clopidogrel  75 mg Oral Daily  . diltiazem  120 mg Oral q morning - 10a  . donepezil  10 mg Oral QHS  . dorzolamide-timolol  1 drop Both Eyes BID  . famotidine  20 mg Oral Daily  . insulin aspart  0-5 Units Subcutaneous QHS  . insulin aspart  0-9 Units Subcutaneous TID WC  . latanoprost  1 drop Both Eyes QHS  . levETIRAcetam  500 mg Oral BID  . lisinopril  40 mg Oral Daily  . potassium chloride SA  40 mEq Oral BID WC  . vancomycin  750 mg Intravenous Q12H   . sodium chloride 100 mL/hr at 04/21/16 2210  . pantoprozole (PROTONIX) infusion 8 mg/hr (04/22/16 0524)    LABS: Basic Metabolic Panel:  Recent Labs  52/84/13 2110 04/21/16 1420 04/22/16 0039  NA 137  --  141  K 3.9  --  4.4  CL 108  --  109  CO2 19*  --  21*  GLUCOSE 210*  --  100*  BUN 20  --  23*  CREATININE 1.52*  --  1.34*  CALCIUM 10.5*  --  9.7  MG   --  1.5*  --   PHOS  --  2.8  --    Liver Function Tests:  Recent Labs  04/20/16 2110 04/22/16 0039  AST 27 29  ALT 25 22  ALKPHOS 77 52  BILITOT 0.6 1.1  PROT 8.9* 6.9  ALBUMIN 4.0 2.8*   No results for input(s): LIPASE, AMYLASE in the last 72 hours. CBC:  Recent Labs  04/20/16 2110  04/21/16 1420 04/22/16 0039  WBC 7.0  < > 9.3 14.7*  NEUTROABS 5.4  --   --   --   HGB 14.0  < > 15.1 13.1  HCT 41.7  < > 45.5 39.9  MCV 91.9  < > 92.5 92.6  PLT  212  < > 184 177  < > = values in this interval not displayed. Cardiac Enzymes:  Recent Labs  04/21/16 0433 04/21/16 0910 04/21/16 1420  TROPONINI <0.03 <0.03 <0.03  <0.03   BNP: No results for input(s): PROBNP in the last 72 hours. D-Dimer: No results for input(s): DDIMER in the last 72 hours. Hemoglobin A1C:  Recent Labs  04/21/16 1420  HGBA1C 5.8*   Fasting Lipid Panel: No results for input(s): CHOL, HDL, LDLCALC, TRIG, CHOLHDL, LDLDIRECT in the last 72 hours. Thyroid Function Tests:  Recent Labs  04/21/16 1420  TSH 1.491     Radiology/Studies:  Ct Head Wo Contrast  Result Date: 04/20/2016 CLINICAL DATA:  Altered mental status. Dementia. Chest pain, cough, and shortness of breath. EXAM: CT HEAD WITHOUT CONTRAST TECHNIQUE: Contiguous axial images were obtained from the base of the skull through the vertex without intravenous contrast. COMPARISON:  None. FINDINGS: Brain: Prominent diffuse cerebral atrophy. Ventricular dilatation consistent with central atrophy. Low-attenuation changes in the deep white matter consistent small vessel ischemia. No mass effect or midline shift. No abnormal extra-axial fluid collections. Gray-white matter junctions are distinct. Basal cisterns are not effaced. No evidence of acute intracranial hemorrhage. Vascular: Atherosclerotic vascular calcifications are present. Skull: Normal. Negative for fracture or focal lesion. Sinuses/Orbits: There is an air-fluid level in the right  maxillary antrum. This may indicate sinusitis. Paranasal sinuses and mastoid air cells are otherwise clear. Other: None. IMPRESSION: No acute intracranial abnormalities. Prominent cerebral atrophy and small vessel ischemic changes. Air-fluid level in the right maxillary antrum may indicate sinus infection. Electronically Signed   By: Burman NievesWilliam  Stevens M.D.   On: 04/20/2016 23:06   Ct Angio Chest Pe W Or Wo Contrast  Result Date: 04/21/2016 CLINICAL DATA:  Shortness of breath, chest pain and abdominal pain. EXAM: CT ANGIOGRAPHY CHEST CT ABDOMEN AND PELVIS WITH CONTRAST TECHNIQUE: Multidetector CT imaging of the chest was performed using the standard protocol during bolus administration of intravenous contrast. Multiplanar CT image reconstructions and MIPs were obtained to evaluate the vascular anatomy. Multidetector CT imaging of the abdomen and pelvis was performed using the standard protocol during bolus administration of intravenous contrast. CONTRAST:  80 mL Isovue 370 IV COMPARISON:  Prior CT [facility] abdomen and pelvis [facility] on 11/02/2012 FINDINGS: CTA CHEST FINDINGS Cardiovascular: Pulmonary arterial opacification is somewhat limited in the peripheral lungs. No pulmonary embolism is identified. The thoracic aorta demonstrates calcified plaque without aneurysmal disease. The heart size is normal. No pericardial fluid identified. Probable calcified plaque in the distribution of the left circumflex coronary artery and also potentially the right coronary artery. Mediastinum/Nodes: No evidence of mediastinal, hilar or axillary lymphadenopathy. No mediastinal masses or fluid collections. The thyroid gland appears unremarkable. Lungs/Pleura: Patchy alveolar airspace disease is noted in the right upper lobe, right middle lobe, right lower lobe and left lower lobe. The most significant involvement is in the right lower lobe. Findings are consistent with pneumonia and may reflect aspiration. No  associated pulmonary edema or pleural effusions. No pneumothorax identified. There is a component of bronchial obstruction, likely reflecting mucous plug formation in the medial and posterior right lower lobe. Musculoskeletal: Spondylosis of the thoracic spine. No bony lesions or fractures identified. Review of the MIP images confirms the above findings. CT ABDOMEN and PELVIS FINDINGS Hepatobiliary: Stable hepatic cysts. The largest remains at the dome of the liver measures approximately 2.4 cm. No evidence of hepatic masses or biliary obstruction. The gallbladder is unremarkable. Pancreas: Unremarkable.  No pancreatic ductal dilatation or surrounding inflammatory changes. Spleen: Normal in size without focal abnormality. Adrenals/Urinary Tract: Adrenal glands are unremarkable. Kidneys are normal, without renal calculi, focal lesion, or hydronephrosis. Bladder is unremarkable and decompressed. Stomach/Bowel: There is a small hiatal hernia. The stomach contains a moderate amount of fluid. There is massive gaseous dilatation of much of the colon with maximal colonic diameter of approximately 11.8 cm. As the colon is followed by CT, there is a transition at the level of the sigmoid colon with decompression of the distal sigmoid and rectum. There is a swirling appearance of the proximal sigmoid and adjacent vessels within the mesentery and findings are suspicious for a sigmoid volvulus. No associated free air or pneumatosis identified. The small bowel is decompressed. No abscess is identified. Vascular/Lymphatic: No enlarged lymph nodes are seen. The abdominal aorta is calcified and without evidence of aneurysm. Reproductive: Unremarkable. Other: Small left inguinal hernia contains a short segment of nondilated small bowel. There is no evidence to suggest incarceration. Musculoskeletal: Bony structures show spondylosis of the lumbar spine. Review of the MIP images confirms the above findings. IMPRESSION: 1. Evidence of  bilateral pneumonia, right greater than left. Findings may reflect aspiration. 2. Coronary and aortic atherosclerosis. No evidence of aortic aneurysm. 3. Findings by CT concerning for sigmoid volvulus with significant gaseous distention of the colon proximal to a level of relative beaking of the sigmoid colon and decompression of the sigmoid and rectum distal to the transition point. Recommend surgical and gastroenterology consultation. 4. These results will be called to the ordering clinician or representative by the Radiologist Assistant, and communication documented in the PACS or zVision Dashboard. Electronically Signed   By: Irish Lack M.D.   On: 04/21/2016 13:19   Ct Abdomen Pelvis W Contrast  Result Date: 04/21/2016 CLINICAL DATA:  Shortness of breath, chest pain and abdominal pain. EXAM: CT ANGIOGRAPHY CHEST CT ABDOMEN AND PELVIS WITH CONTRAST TECHNIQUE: Multidetector CT imaging of the chest was performed using the standard protocol during bolus administration of intravenous contrast. Multiplanar CT image reconstructions and MIPs were obtained to evaluate the vascular anatomy. Multidetector CT imaging of the abdomen and pelvis was performed using the standard protocol during bolus administration of intravenous contrast. CONTRAST:  80 mL Isovue 370 IV COMPARISON:  Prior CT of the abdomen and pelvis at [facility] on 11/02/2012 FINDINGS: CTA CHEST FINDINGS Cardiovascular: Pulmonary arterial opacification is somewhat limited in the peripheral lungs. No pulmonary embolism is identified. The thoracic aorta demonstrates calcified plaque without aneurysmal disease. The heart size is normal. No pericardial fluid identified. Probable calcified plaque in the distribution of the left circumflex coronary artery and also potentially the right coronary artery. Mediastinum/Nodes: No evidence of mediastinal, hilar or axillary lymphadenopathy. No mediastinal masses or fluid collections. The thyroid gland  appears unremarkable. Lungs/Pleura: Patchy alveolar airspace disease is noted in the right upper lobe, right middle lobe, right lower lobe and left lower lobe. The most significant involvement is in the right lower lobe. Findings are consistent with pneumonia and may reflect aspiration. No associated pulmonary edema or pleural effusions. No pneumothorax identified. There is a component of bronchial obstruction, likely reflecting mucous plug formation in the medial and posterior right lower lobe. Musculoskeletal: Spondylosis of the thoracic spine. No bony lesions or fractures identified. Review of the MIP images confirms the above findings. CT ABDOMEN and PELVIS FINDINGS Hepatobiliary: Stable hepatic cysts. The largest remains at the dome of the liver measures approximately 2.4 cm. No evidence of hepatic  masses or biliary obstruction. The gallbladder is unremarkable. Pancreas: Unremarkable. No pancreatic ductal dilatation or surrounding inflammatory changes. Spleen: Normal in size without focal abnormality. Adrenals/Urinary Tract: Adrenal glands are unremarkable. Kidneys are normal, without renal calculi, focal lesion, or hydronephrosis. Bladder is unremarkable and decompressed. Stomach/Bowel: There is a small hiatal hernia. The stomach contains a moderate amount of fluid. There is massive gaseous dilatation of much of the colon with maximal colonic diameter of approximately 11.8 cm. As the colon is followed by CT, there is a transition at the level of the sigmoid colon with decompression of the distal sigmoid and rectum. There is a swirling appearance of the proximal sigmoid and adjacent vessels within the mesentery and findings are suspicious for a sigmoid volvulus. No associated free air or pneumatosis identified. The small bowel is decompressed. No abscess is identified. Vascular/Lymphatic: No enlarged lymph nodes are seen. The abdominal aorta is calcified and without evidence of aneurysm. Reproductive:  Unremarkable. Other: Small left inguinal hernia contains a short segment of nondilated small bowel. There is no evidence to suggest incarceration. Musculoskeletal: Bony structures show spondylosis of the lumbar spine. Review of the MIP images confirms the above findings. IMPRESSION: 1. Evidence of bilateral pneumonia, right greater than left. Findings may reflect aspiration. 2. Coronary and aortic atherosclerosis. No evidence of aortic aneurysm. 3. Findings by CT concerning for sigmoid volvulus with significant gaseous distention of the colon proximal to a level of relative beaking of the sigmoid colon and decompression of the sigmoid and rectum distal to the transition point. Recommend surgical and gastroenterology consultation. 4. These results will be called to the ordering clinician or representative by the Radiologist Assistant, and communication documented in the PACS or zVision Dashboard. Electronically Signed   By: Irish Lack M.D.   On: 04/21/2016 13:19   Dg Chest Portable 1 View  Result Date: 04/20/2016 CLINICAL DATA:  Dyspnea since this evening. History of dementia, infarct, hypertension and pacemaker all. EXAM: PORTABLE CHEST 1 VIEW COMPARISON:  12/25/2015. FINDINGS: Cardiomegaly with tortuous ectatic appearing thoracic aorta. Right ventricular pacing wire is noted with left-sided pacemaker apparatus. Skin fold artifact along the periphery of the right hemithorax. No pneumonic consolidation, CHF, pneumothorax nor effusion. Moderate colonic distention below the diaphragm up to 8 cm possibly representing pseudo-obstruction. No suspicious osseous abnormalities. IMPRESSION: No acute cardiopulmonary disease. Cardiomegaly. Skin fold artifact along the periphery of the right thorax. Electronically Signed   By: Tollie Eth M.D.   On: 04/20/2016 22:07    PHYSICAL EXAM General: thin, frail, male in no acute distress Head: normal. No JVD Lungs: Resp regular and unlabored, CTA. Heart: IRRR no s3, s4,  or murmurs..   Neck: No carotid bruits. No lymphadenopathy.  JVD. Abdomen: Bowel sounds present, abdomen soft and non-tender without masses or hernias noted. Extremities: No clubbing, cyanosis or edema. DP/PT/Radials 2+ and equal bilaterally. Neuro: Alert and oriented X 2. No focal deficits noted. Skin: No rashes or lesions noted.  ASSESSMENT AND PLAN: 1. Atrial fibrillation and atypical atrial flutter. Duration unknown. Rate controlled on diltiazem. [place] Vasc score of 5. Recommend anticoagulation. I would consider a NOAC. Case management to address cost. Once on anticoagulation I would stop ASA and Plavix.  Check Echo today. 2. History of bradycardia s/p pacemaker. [employer]. Will interrogate pacemaker today. ? If this can tell how long he has been in Afib.  3. HTN poorly controlled.  Now on amlodipine, lisinopril and Cardizem. Will stop Cardizem and start a beta blocker for rate control and HTN.  4. Sigmoid volvulus. S/p decompression.  Present on Admission: . Chest pain . Volvulus (HCC)   Signed, Riniyah Speich [place], MDFACC 04/22/2016 8:38 AM

## 2016-04-22 NOTE — Evaluation (Signed)
Occupational Therapy Evaluation Patient Details Name: Edwin Wilson MRN: [id number] DOB: September 23, 1929 Today's Date: 04/22/2016    History of Present Illness Pt presented with chest/abdomen pain. Pt found to have volvulus and PNA. PMH - dementia, HTN, CVA   Clinical Impression   Patient evaluated by Occupational Therapy with no further acute OT needs identified. All education has been completed and the patient has no further questions. Pt is at, or close to baseline with ADLs.  See below for any follow-up Occupational Therapy or equipment needs. OT is signing off. Thank you for this referral.      Follow Up Recommendations  No OT follow up;Supervision/Assistance - 24 hour    Equipment Recommendations  None recommended by OT    Recommendations for Other Services       Precautions / Restrictions Precautions Precautions: Fall      Mobility Bed Mobility Overal bed mobility: Needs Assistance Bed Mobility: Supine to Sit;Sit to Supine     Supine to sit: Supervision Sit to supine: Supervision   General bed mobility comments: supervision due to cognitive deficits   Transfers Overall transfer level: Needs assistance Equipment used: None Transfers: Sit to/from BJ'sStand;Stand Pivot Transfers Sit to Stand: Supervision Stand pivot transfers: Supervision       General transfer comment: for safety    Balance Overall balance assessment: Needs assistance Sitting-balance support: No upper extremity supported Sitting balance-Leahy Scale: Good     Standing balance support: During functional activity Standing balance-Leahy Scale: Fair                              ADL Overall ADL's : At baseline                                     Functional mobility during ADLs: Rolling walker;Supervision/safety General ADL Comments: Pt requires cues and assist due to cognitive deficits.  He is at, or close to his baseline with ADLs      Vision      Perception     Praxis      Pertinent Vitals/Pain Pain Assessment: No/denies pain Faces Pain Scale: No hurt     Hand Dominance     Extremity/Trunk Assessment Upper Extremity Assessment Upper Extremity Assessment: Generalized weakness   Lower Extremity Assessment Lower Extremity Assessment: Defer to PT evaluation       Communication Communication Communication: No difficulties   Cognition Arousal/Alertness: Awake/alert Behavior During Therapy: Flat affect Overall Cognitive Status: History of cognitive impairments - at baseline                     General Comments       Exercises       Shoulder Instructions      Home Living Family/patient expects to be discharged to:: Private residence Living Arrangements: Spouse/significant other Available Help at Discharge: Family;Available 24 hours/day;Personal care attendant Type of Home: House Home Access: Level entry     Home Layout: One level     Bathroom Shower/Tub: Producer, television/film/videoWalk-in shower   Bathroom Toilet: Standard     Home Equipment: Shower seat          Prior Functioning/Environment Level of Independence: Needs assistance  Gait / Transfers Assistance Needed: amb modified independent without assistive device ADL's / Homemaking Assistance Needed: Pt has aide 3 days/week who bathe and dress him.  Wife assists  on the other days.  He is able to assist minimally.  Pt is on a toileting scheduled and once prompted to use BR, he is able to perform without Assist             OT Problem List: Decreased strength;Impaired balance (sitting and/or standing)   OT Treatment/Interventions:      OT Goals(Current goals can be found in the care plan section) Acute Rehab OT Goals Patient Stated Goal: to go home  OT Goal Formulation: All assessment and education complete, DC therapy  OT Frequency:     Barriers to D/C:            Co-evaluation              End of Session Equipment Utilized During Treatment:  Rolling walker;Gait belt Nurse Communication: Mobility status  Activity Tolerance: Patient tolerated treatment well Patient left: in bed;with call bell/phone within reach;with bed alarm set;with family/visitor present   Time: 1510-1531 OT Time Calculation (min): 21 min Charges:  OT General Charges $OT Visit: 1 Procedure OT Evaluation $OT Eval Low Complexity: 1 Procedure G-Codes:    Sahiba Granholm M 2016-05-03, 6:00 PM

## 2016-04-22 NOTE — Progress Notes (Signed)
PROGRESS NOTE  Edwin Wilson  UJW:[id number] DOB: 22-Sep-1929  DOA: 04/20/2016 PCP: Patria Mane, MD   Brief Narrative:  82 year old male with PMH of advanced dementia, HLD, HTN, CVA, A. fib presented to [facility] ED on 04/20/16 with complaints of dyspnea, sweating, chest and abdominal pain. Patient unable to provide history secondary to advanced dementia and no family were available at that time. He lived at an assisted living facility until approximately a week prior to admission when he was brought home. In the ED, had a single episode of bloody emesis although it was not clear if this was emesis or hemoptysis. Also noted to have atrial flutter, acute kidney injury.   Assessment & Plan:   Active Problems:   Chest pain   Shortness of breath   Essential hypertension   Dementia with behavioral disturbance   Seizures (HCC)   BPV (benign positional vertigo)   Hyperglycemia   Volvulus (HCC)   Abdominal distension   Sigmoid volvulus (HCC)   Chest pain/dyspnea - Resolved without recurrence. Patient has advanced dementia and his history is unreliable. - Troponin 5: Negative.  Abdominal pain/sigmoid volvulus - [facility] was consulted and patient underwent emergent flexible sigmoidoscopy with decompression on 10/3. General surgery consultation appreciated: Not a good surgical candidate but consideration if volvulus recurs. - Abdominal x-ray shows slight improvement.  Hematemesis versus ? hemoptysis  - Single episode in ED on 10/3. No recurrence.  [facility] follow-up appreciated. Stopped IV PPI infusion and continue Pepcid twice a day  Possible healthcare associated pneumonia versus aspiration pneumonia - Placed on vancomycin and Maxipime. Consider narrowing antibiotic spectrum on 10/5.   Atrial fibrillation and atypical Atrial flutter/status post previous pacemaker for bradycardia - Cardiology consultation and follow-up appreciated. Duration unknown. Cardizem was changed to  carvedilol for better rate control. - [place] Vasc score of 5. Cardiology recommends anticoagulation/NOAC and discontinue aspirin and Plavix once anticoagulation started. However patient had an episode of hematemesis in ED on 10/3. Hold off on starting anticoagulation for now. Reevaluate in a.m. and discuss with cardiology and GI. - Cardiology plans to interrogate PM   Essential hypertension - Uncontrolled. Currently on amlodipine, lisinopril and Cardizem. Cardiology have stopped Cardizem and started carvedilol for rate control.   Acute on possible stage III chronic kidney disease - Baseline creatinine is not known. Presented with creatinine of 1.5 which is improved to 1.3. Follow BMP  Advanced dementia - Confusion overnight and wanting to get out of bed. Likely sundowning. Pleasant and cooperative this morning.  Seizure disorder - Keppra  Hyperglycemia without history of DM  History of CVA - On Plavix & Plavix - note hematemesis on 10/3> monitor for now. See discussion re AC as above.  Dysphagia - Seen with speech therapy in room. Starting dysphagia 3 diet.   DVT prophylaxis: Heparin> change to SCD d/t to hemetemesis Code Status: Full Family Communication: None at bedside Disposition Plan: DC home when medically improved   Consultants:   [facility]  Gen. Surgery  Cardiology  Procedures:   Flexible sigmoidoscopy 04/21/16: Impression:               - Volvulus. Successful complete decompression                            achieved.                           - No specimens collected.  Moderate Sedation:      Moderate (conscious) sedation was administered by the endoscopy nurse       and supervised by the endoscopist. The following parameters were       monitored: oxygen saturation, heart rate, blood pressure, and response       to care. Total physician intraservice time was 12 minutes. Recommendation:           - Use Benefiber two teaspoons PO TID.                            - Dulcolax 1-3 tabs or caps PO daily to have 1-2                            soft BM per day.                           - Advance diet as tolerated.                           - Continue present medications.                           -Out of bed to chair and ambulate as tolerated                           - Consult surgery for possible sigmoid pexy to                            prevent recurrence                           -Will sign off, available for any questions  Antimicrobials:   IV Cefepime  IV Vancomycin    Subjective: Pleasantly confused. Unable to provide any history. As per RN, no acute issues. No further bleeding reported. Patient denies abdominal pain. Being evaluated by speech therapy in room this morning.   Objective:  Vitals:   04/21/16 1600 04/21/16 2010 04/22/16 0510 04/22/16 0700  BP: 136/71 (!) 160/80 (!) 186/86   Pulse: 80 82 78   Resp:  20 20   Temp:  97.3 F (36.3 C) 97.6 F (36.4 C)   TempSrc:  Axillary Axillary   SpO2: 93% 97% 96%   Weight:    67 kg (147 lb 9.6 oz)  Height:        Intake/Output Summary (Last 24 hours) at 04/22/16 1509 Last data filed at 04/22/16 1509  Gross per 24 hour  Intake                0 ml  Output             1100 ml  Net            -1100 ml   Filed Weights   04/20/16 2058 04/21/16 0235 04/22/16 0700  Weight: 71.7 kg (158 lb) 93.2 kg (205 lb 7.5 oz) 67 kg (147 lb 9.6 oz)    Examination:  General exam: Frail elderly male, chronically ill-looking, lying comfortably propped up in bed.  Respiratory system: Clear to auscultation. Respiratory effort normal. Cardiovascular system: S1 & S2 heard, RRR. No JVD, murmurs, rubs, gallops  or clicks. No pedal edema. telemetry: A. fib with controlled ventricular rate/intermittent pacemaker spikes.  Gastrointestinal system: Abdomen is nondistended, soft and nontender. No organomegaly or masses felt. Normal bowel sounds heard. Central nervous system: Alert and oriented only to self . No  focal neurological deficits. Extremities: Symmetric 5 x 5 power. Resting coarse tremors of left upper extremity with cogwheel rigidity  Skin: No rashes, lesions or ulcers Psychiatry: Judgement and insight impaired. Mood & affect appropriate.     Data Reviewed: I have personally reviewed following labs and imaging studies  CBC:  Recent Labs Lab 04/20/16 2110 04/21/16 0433 04/21/16 0910 04/21/16 1420 04/22/16 0039  WBC 7.0 6.4 6.6 9.3 14.7*  NEUTROABS 5.4  --   --   --   --   HGB 14.0 14.7 15.8 15.1 13.1  HCT 41.7 45.2 47.3 45.5 39.9  MCV 91.9 92.8 91.8 92.5 92.6  PLT 212 211 198 184 177   Basic Metabolic Panel:  Recent Labs Lab 04/20/16 2110 04/21/16 1420 04/22/16 0039  NA 137  --  141  K 3.9  --  4.4  CL 108  --  109  CO2 19*  --  21*  GLUCOSE 210*  --  100*  BUN 20  --  23*  CREATININE 1.52*  --  1.34*  CALCIUM 10.5*  --  9.7  MG  --  1.5*  --   PHOS  --  2.8  --    GFR: Estimated Creatinine Clearance: 39.6 mL/min (by C-G formula based on SCr of 1.34 mg/dL (H)). Liver Function Tests:  Recent Labs Lab 04/20/16 2110 04/22/16 0039  AST 27 29  ALT 25 22  ALKPHOS 77 52  BILITOT 0.6 1.1  PROT 8.9* 6.9  ALBUMIN 4.0 2.8*   No results for input(s): LIPASE, AMYLASE in the last 168 hours. No results for input(s): AMMONIA in the last 168 hours. Coagulation Profile:  Recent Labs Lab 04/21/16 0433 04/21/16 1420  INR 1.28 1.34   Cardiac Enzymes:  Recent Labs Lab 04/20/16 2110 04/21/16 0433 04/21/16 0910 04/21/16 1420  TROPONINI <0.03 <0.03 <0.03 <0.03  <0.03   BNP (last 3 results) No results for input(s): PROBNP in the last 8760 hours. HbA1C:  Recent Labs  04/21/16 1420  HGBA1C 5.8*   CBG:  Recent Labs Lab 04/21/16 1325 04/21/16 1820 04/21/16 2144 04/22/16 0617 04/22/16 1138  GLUCAP 109* 118* 88 87 84   Lipid Profile: No results for input(s): CHOL, HDL, LDLCALC, TRIG, CHOLHDL, LDLDIRECT in the last 72 hours. Thyroid Function  Tests:  Recent Labs  04/21/16 1420  TSH 1.491  FREET4 1.48*   Anemia Panel: No results for input(s): VITAMINB12, FOLATE, FERRITIN, TIBC, IRON, RETICCTPCT in the last 72 hours.  Sepsis Labs:  Recent Labs Lab 04/21/16 1654  LATICACIDVEN 3.6*    No results found for this or any previous visit (from the past 240 hour(s)).       Radiology Studies: Ct Head Wo Contrast  Result Date: 04/20/2016 CLINICAL DATA:  Altered mental status. Dementia. Chest pain, cough, and shortness of breath. EXAM: CT HEAD WITHOUT CONTRAST TECHNIQUE: Contiguous axial images were obtained from the base of the skull through the vertex without intravenous contrast. COMPARISON:  None. FINDINGS: Brain: Prominent diffuse cerebral atrophy. Ventricular dilatation consistent with central atrophy. Low-attenuation changes in the deep white matter consistent small vessel ischemia. No mass effect or midline shift. No abnormal extra-axial fluid collections. Gray-white matter junctions are distinct. Basal cisterns are not effaced. No evidence of acute  intracranial hemorrhage. Vascular: Atherosclerotic vascular calcifications are present. Skull: Normal. Negative for fracture or focal lesion. Sinuses/Orbits: There is an air-fluid level in the right maxillary antrum. This may indicate sinusitis. Paranasal sinuses and mastoid air cells are otherwise clear. Other: None. IMPRESSION: No acute intracranial abnormalities. Prominent cerebral atrophy and small vessel ischemic changes. Air-fluid level in the right maxillary antrum may indicate sinus infection. Electronically Signed   By: Burman NievesWilliam  Stevens M.D.   On: 04/20/2016 23:06   Ct Angio Chest Pe W Or Wo Contrast  Result Date: 04/21/2016 CLINICAL DATA:  Shortness of breath, chest pain and abdominal pain. EXAM: CT ANGIOGRAPHY CHEST CT ABDOMEN AND PELVIS WITH CONTRAST TECHNIQUE: Multidetector CT imaging of the chest was performed using the standard protocol during bolus administration of  intravenous contrast. Multiplanar CT image reconstructions and MIPs were obtained to evaluate the vascular anatomy. Multidetector CT imaging of the abdomen and pelvis was performed using the standard protocol during bolus administration of intravenous contrast. CONTRAST:  80 mL Isovue 370 IV COMPARISON:  Prior CT of the abdomen and pelvis at [facility] on 11/02/2012 FINDINGS: CTA CHEST FINDINGS Cardiovascular: Pulmonary arterial opacification is somewhat limited in the peripheral lungs. No pulmonary embolism is identified. The thoracic aorta demonstrates calcified plaque without aneurysmal disease. The heart size is normal. No pericardial fluid identified. Probable calcified plaque in the distribution of the left circumflex coronary artery and also potentially the right coronary artery. Mediastinum/Nodes: No evidence of mediastinal, hilar or axillary lymphadenopathy. No mediastinal masses or fluid collections. The thyroid gland appears unremarkable. Lungs/Pleura: Patchy alveolar airspace disease is noted in the right upper lobe, right middle lobe, right lower lobe and left lower lobe. The most significant involvement is in the right lower lobe. Findings are consistent with pneumonia and may reflect aspiration. No associated pulmonary edema or pleural effusions. No pneumothorax identified. There is a component of bronchial obstruction, likely reflecting mucous plug formation in the medial and posterior right lower lobe. Musculoskeletal: Spondylosis of the thoracic spine. No bony lesions or fractures identified. Review of the MIP images confirms the above findings. CT ABDOMEN and PELVIS FINDINGS Hepatobiliary: Stable hepatic cysts. The largest remains at the dome of the liver measures approximately 2.4 cm. No evidence of hepatic masses or biliary obstruction. The gallbladder is unremarkable. Pancreas: Unremarkable. No pancreatic ductal dilatation or surrounding inflammatory changes. Spleen: Normal in size  without focal abnormality. Adrenals/Urinary Tract: Adrenal glands are unremarkable. Kidneys are normal, without renal calculi, focal lesion, or hydronephrosis. Bladder is unremarkable and decompressed. Stomach/Bowel: There is a small hiatal hernia. The stomach contains a moderate amount of fluid. There is massive gaseous dilatation of much of the colon with maximal colonic diameter of approximately 11.8 cm. As the colon is followed by CT, there is a transition at the level of the sigmoid colon with decompression of the distal sigmoid and rectum. There is a swirling appearance of the proximal sigmoid and adjacent vessels within the mesentery and findings are suspicious for a sigmoid volvulus. No associated free air or pneumatosis identified. The small bowel is decompressed. No abscess is identified. Vascular/Lymphatic: No enlarged lymph nodes are seen. The abdominal aorta is calcified and without evidence of aneurysm. Reproductive: Unremarkable. Other: Small left inguinal hernia contains a short segment of nondilated small bowel. There is no evidence to suggest incarceration. Musculoskeletal: Bony structures show spondylosis of the lumbar spine. Review of the MIP images confirms the above findings. IMPRESSION: 1. Evidence of bilateral pneumonia, right greater than left. Findings may reflect  aspiration. 2. Coronary and aortic atherosclerosis. No evidence of aortic aneurysm. 3. Findings by CT concerning for sigmoid volvulus with significant gaseous distention of the colon proximal to a level of relative beaking of the sigmoid colon and decompression of the sigmoid and rectum distal to the transition point. Recommend surgical and gastroenterology consultation. 4. These results will be called to the ordering clinician or representative by the Radiologist Assistant, and communication documented in the PACS or zVision Dashboard. Electronically Signed   By: Irish Lack M.D.   On: 04/21/2016 13:19   Ct Abdomen Pelvis W  Contrast  Result Date: 04/21/2016 CLINICAL DATA:  Shortness of breath, chest pain and abdominal pain. EXAM: CT ANGIOGRAPHY CHEST CT ABDOMEN AND PELVIS WITH CONTRAST TECHNIQUE: Multidetector CT imaging of the chest was performed using the standard protocol during bolus administration of intravenous contrast. Multiplanar CT image reconstructions and MIPs were obtained to evaluate the vascular anatomy. Multidetector CT imaging of the abdomen and pelvis was performed using the standard protocol during bolus administration of intravenous contrast. CONTRAST:  80 mL Isovue 370 IV COMPARISON:  Prior CT of the abdomen and pelvis at [facility] on 11/02/2012 FINDINGS: CTA CHEST FINDINGS Cardiovascular: Pulmonary arterial opacification is somewhat limited in the peripheral lungs. No pulmonary embolism is identified. The thoracic aorta demonstrates calcified plaque without aneurysmal disease. The heart size is normal. No pericardial fluid identified. Probable calcified plaque in the distribution of the left circumflex coronary artery and also potentially the right coronary artery. Mediastinum/Nodes: No evidence of mediastinal, hilar or axillary lymphadenopathy. No mediastinal masses or fluid collections. The thyroid gland appears unremarkable. Lungs/Pleura: Patchy alveolar airspace disease is noted in the right upper lobe, right middle lobe, right lower lobe and left lower lobe. The most significant involvement is in the right lower lobe. Findings are consistent with pneumonia and may reflect aspiration. No associated pulmonary edema or pleural effusions. No pneumothorax identified. There is a component of bronchial obstruction, likely reflecting mucous plug formation in the medial and posterior right lower lobe. Musculoskeletal: Spondylosis of the thoracic spine. No bony lesions or fractures identified. Review of the MIP images confirms the above findings. CT ABDOMEN and PELVIS FINDINGS Hepatobiliary: Stable hepatic  cysts. The largest remains at the dome of the liver measures approximately 2.4 cm. No evidence of hepatic masses or biliary obstruction. The gallbladder is unremarkable. Pancreas: Unremarkable. No pancreatic ductal dilatation or surrounding inflammatory changes. Spleen: Normal in size without focal abnormality. Adrenals/Urinary Tract: Adrenal glands are unremarkable. Kidneys are normal, without renal calculi, focal lesion, or hydronephrosis. Bladder is unremarkable and decompressed. Stomach/Bowel: There is a small hiatal hernia. The stomach contains a moderate amount of fluid. There is massive gaseous dilatation of much of the colon with maximal colonic diameter of approximately 11.8 cm. As the colon is followed by CT, there is a transition at the level of the sigmoid colon with decompression of the distal sigmoid and rectum. There is a swirling appearance of the proximal sigmoid and adjacent vessels within the mesentery and findings are suspicious for a sigmoid volvulus. No associated free air or pneumatosis identified. The small bowel is decompressed. No abscess is identified. Vascular/Lymphatic: No enlarged lymph nodes are seen. The abdominal aorta is calcified and without evidence of aneurysm. Reproductive: Unremarkable. Other: Small left inguinal hernia contains a short segment of nondilated small bowel. There is no evidence to suggest incarceration. Musculoskeletal: Bony structures show spondylosis of the lumbar spine. Review of the MIP images confirms the above findings. IMPRESSION: 1. Evidence  of bilateral pneumonia, right greater than left. Findings may reflect aspiration. 2. Coronary and aortic atherosclerosis. No evidence of aortic aneurysm. 3. Findings by CT concerning for sigmoid volvulus with significant gaseous distention of the colon proximal to a level of relative beaking of the sigmoid colon and decompression of the sigmoid and rectum distal to the transition point. Recommend surgical and  gastroenterology consultation. 4. These results will be called to the ordering clinician or representative by the Radiologist Assistant, and communication documented in the PACS or zVision Dashboard. Electronically Signed   By: Irish Lack M.D.   On: 04/21/2016 13:19   Dg Chest Portable 1 View  Result Date: 04/20/2016 CLINICAL DATA:  Dyspnea since this evening. History of dementia, infarct, hypertension and pacemaker all. EXAM: PORTABLE CHEST 1 VIEW COMPARISON:  12/25/2015. FINDINGS: Cardiomegaly with tortuous ectatic appearing thoracic aorta. Right ventricular pacing wire is noted with left-sided pacemaker apparatus. Skin fold artifact along the periphery of the right hemithorax. No pneumonic consolidation, CHF, pneumothorax nor effusion. Moderate colonic distention below the diaphragm up to 8 cm possibly representing pseudo-obstruction. No suspicious osseous abnormalities. IMPRESSION: No acute cardiopulmonary disease. Cardiomegaly. Skin fold artifact along the periphery of the right thorax. Electronically Signed   By: Tollie Eth M.D.   On: 04/20/2016 22:07   Dg Abd Portable 1v  Result Date: 04/22/2016 CLINICAL DATA:  Abdominal distention. Volvulus status post endoscopic decompression yesterday. EXAM: PORTABLE ABDOMEN - 1 VIEW COMPARISON:  CT 04/21/2016 FINDINGS: Diffuse gaseous distention of the colon, decreased slightly since prior CT. No free air organomegaly. Lung bases are clear. No acute bony abnormality. IMPRESSION: Slight improvement in diffuse gaseous distention of the colon since prior study. Electronically Signed   By: Charlett Nose M.D.   On: 04/22/2016 12:51        Scheduled Meds: . amLODipine  10 mg Oral Daily  . aspirin EC  81 mg Oral Daily  . atorvastatin  80 mg Oral Daily  . bimatoprost  1 drop Both Eyes QHS  . brimonidine  1 drop Both Eyes Q12H   And  . timolol  1 drop Both Eyes BID  . carvedilol  25 mg Oral BID WC  . ceFEPime (MAXIPIME) IV  1 g Intravenous Q24H  .  clopidogrel  75 mg Oral Daily  . donepezil  10 mg Oral QHS  . dorzolamide-timolol  1 drop Both Eyes BID  . famotidine  20 mg Oral BID  . insulin aspart  0-5 Units Subcutaneous QHS  . insulin aspart  0-9 Units Subcutaneous TID WC  . latanoprost  1 drop Both Eyes QHS  . levETIRAcetam  500 mg Oral BID  . lisinopril  40 mg Oral Daily  . potassium chloride SA  40 mEq Oral BID WC  . vancomycin  750 mg Intravenous Q12H   Continuous Infusions: . sodium chloride 100 mL/hr at 04/22/16 1426     LOS: 1 day    Time spent: 40 minutes.    [facility], MD [facility] Pager [phone]  If 7PM-7AM, please contact night-coverage [email or web address] Password [email or web address] 04/22/2016, 3:09 PM

## 2016-04-22 NOTE — Progress Notes (Signed)
1 Day Post-Op  Subjective: Awake but confused  Objective: Vital signs in last 24 hours: Temp:  [97.3 F (36.3 C)-97.6 F (36.4 C)] 97.6 F (36.4 C) (10/04 0510) Pulse Rate:  [56-82] 78 (10/04 0510) Resp:  [16-21] 20 (10/04 0510) BP: (132-203)/(64-152) 186/86 (10/04 0510) SpO2:  [92 %-100 %] 96 % (10/04 0510) Weight:  [67 kg (147 lb 9.6 oz)] 67 kg (147 lb 9.6 oz) (10/04 0700) Last BM Date: 04/21/16  Intake/Output from previous day: 10/03 0701 - 10/04 0700 In: 0  Out: 800 [Urine:800] Intake/Output this shift: No intake/output data recorded.   Exam: Abdomen a little distended this morning Non tender  Lab Results:   Recent Labs  04/21/16 1420 04/22/16 0039  WBC 9.3 14.7*  HGB 15.1 13.1  HCT 45.5 39.9  PLT 184 177   BMET  Recent Labs  04/20/16 2110 04/22/16 0039  NA 137 141  K 3.9 4.4  CL 108 109  CO2 19* 21*  GLUCOSE 210* 100*  BUN 20 23*  CREATININE 1.52* 1.34*  CALCIUM 10.5* 9.7   PT/INR  Recent Labs  04/21/16 0433 04/21/16 1420  LABPROT 16.1* 16.7*  INR 1.28 1.34   ABG No results for input(s): PHART, HCO3 in the last 72 hours.  Invalid input(s): PCO2, PO2  Studies/Results: Ct Head Wo Contrast  Result Date: 04/20/2016 CLINICAL DATA:  Altered mental status. Dementia. Chest pain, cough, and shortness of breath. EXAM: CT HEAD WITHOUT CONTRAST TECHNIQUE: Contiguous axial images were obtained from the base of the skull through the vertex without intravenous contrast. COMPARISON:  None. FINDINGS: Brain: Prominent diffuse cerebral atrophy. Ventricular dilatation consistent with central atrophy. Low-attenuation changes in the deep white matter consistent small vessel ischemia. No mass effect or midline shift. No abnormal extra-axial fluid collections. Gray-white matter junctions are distinct. Basal cisterns are not effaced. No evidence of acute intracranial hemorrhage. Vascular: Atherosclerotic vascular calcifications are present. Skull: Normal. Negative  for fracture or focal lesion. Sinuses/Orbits: There is an air-fluid level in the right maxillary antrum. This may indicate sinusitis. Paranasal sinuses and mastoid air cells are otherwise clear. Other: None. IMPRESSION: No acute intracranial abnormalities. Prominent cerebral atrophy and small vessel ischemic changes. Air-fluid level in the right maxillary antrum may indicate sinus infection. Electronically Signed   By: Burman Nieves M.D.   On: 04/20/2016 23:06   Ct Angio Chest Pe W Or Wo Contrast  Result Date: 04/21/2016 CLINICAL DATA:  Shortness of breath, chest pain and abdominal pain. EXAM: CT ANGIOGRAPHY CHEST CT ABDOMEN AND PELVIS WITH CONTRAST TECHNIQUE: Multidetector CT imaging of the chest was performed using the standard protocol during bolus administration of intravenous contrast. Multiplanar CT image reconstructions and MIPs were obtained to evaluate the vascular anatomy. Multidetector CT imaging of the abdomen and pelvis was performed using the standard protocol during bolus administration of intravenous contrast. CONTRAST:  80 mL Isovue 370 IV COMPARISON:  Prior CT of the abdomen and pelvis at [facility] on 11/02/2012 FINDINGS: CTA CHEST FINDINGS Cardiovascular: Pulmonary arterial opacification is somewhat limited in the peripheral lungs. No pulmonary embolism is identified. The thoracic aorta demonstrates calcified plaque without aneurysmal disease. The heart size is normal. No pericardial fluid identified. Probable calcified plaque in the distribution of the left circumflex coronary artery and also potentially the right coronary artery. Mediastinum/Nodes: No evidence of mediastinal, hilar or axillary lymphadenopathy. No mediastinal masses or fluid collections. The thyroid gland appears unremarkable. Lungs/Pleura: Patchy alveolar airspace disease is noted in the right upper lobe, right middle  lobe, right lower lobe and left lower lobe. The most significant involvement is in the right  lower lobe. Findings are consistent with pneumonia and may reflect aspiration. No associated pulmonary edema or pleural effusions. No pneumothorax identified. There is a component of bronchial obstruction, likely reflecting mucous plug formation in the medial and posterior right lower lobe. Musculoskeletal: Spondylosis of the thoracic spine. No bony lesions or fractures identified. Review of the MIP images confirms the above findings. CT ABDOMEN and PELVIS FINDINGS Hepatobiliary: Stable hepatic cysts. The largest remains at the dome of the liver measures approximately 2.4 cm. No evidence of hepatic masses or biliary obstruction. The gallbladder is unremarkable. Pancreas: Unremarkable. No pancreatic ductal dilatation or surrounding inflammatory changes. Spleen: Normal in size without focal abnormality. Adrenals/Urinary Tract: Adrenal glands are unremarkable. Kidneys are normal, without renal calculi, focal lesion, or hydronephrosis. Bladder is unremarkable and decompressed. Stomach/Bowel: There is a small hiatal hernia. The stomach contains a moderate amount of fluid. There is massive gaseous dilatation of much of the colon with maximal colonic diameter of approximately 11.8 cm. As the colon is followed by CT, there is a transition at the level of the sigmoid colon with decompression of the distal sigmoid and rectum. There is a swirling appearance of the proximal sigmoid and adjacent vessels within the mesentery and findings are suspicious for a sigmoid volvulus. No associated free air or pneumatosis identified. The small bowel is decompressed. No abscess is identified. Vascular/Lymphatic: No enlarged lymph nodes are seen. The abdominal aorta is calcified and without evidence of aneurysm. Reproductive: Unremarkable. Other: Small left inguinal hernia contains a short segment of nondilated small bowel. There is no evidence to suggest incarceration. Musculoskeletal: Bony structures show spondylosis of the lumbar spine.  Review of the MIP images confirms the above findings. IMPRESSION: 1. Evidence of bilateral pneumonia, right greater than left. Findings may reflect aspiration. 2. Coronary and aortic atherosclerosis. No evidence of aortic aneurysm. 3. Findings by CT concerning for sigmoid volvulus with significant gaseous distention of the colon proximal to a level of relative beaking of the sigmoid colon and decompression of the sigmoid and rectum distal to the transition point. Recommend surgical and gastroenterology consultation. 4. These results will be called to the ordering clinician or representative by the Radiologist Assistant, and communication documented in the PACS or zVision Dashboard. Electronically Signed   By: Irish Lack M.D.   On: 04/21/2016 13:19   Ct Abdomen Pelvis W Contrast  Result Date: 04/21/2016 CLINICAL DATA:  Shortness of breath, chest pain and abdominal pain. EXAM: CT ANGIOGRAPHY CHEST CT ABDOMEN AND PELVIS WITH CONTRAST TECHNIQUE: Multidetector CT imaging of the chest was performed using the standard protocol during bolus administration of intravenous contrast. Multiplanar CT image reconstructions and MIPs were obtained to evaluate the vascular anatomy. Multidetector CT imaging of the abdomen and pelvis was performed using the standard protocol during bolus administration of intravenous contrast. CONTRAST:  80 mL Isovue 370 IV COMPARISON:  Prior CT of the abdomen and pelvis at [facility] on 11/02/2012 FINDINGS: CTA CHEST FINDINGS Cardiovascular: Pulmonary arterial opacification is somewhat limited in the peripheral lungs. No pulmonary embolism is identified. The thoracic aorta demonstrates calcified plaque without aneurysmal disease. The heart size is normal. No pericardial fluid identified. Probable calcified plaque in the distribution of the left circumflex coronary artery and also potentially the right coronary artery. Mediastinum/Nodes: No evidence of mediastinal, hilar or axillary  lymphadenopathy. No mediastinal masses or fluid collections. The thyroid gland appears unremarkable. Lungs/Pleura: Patchy alveolar  airspace disease is noted in the right upper lobe, right middle lobe, right lower lobe and left lower lobe. The most significant involvement is in the right lower lobe. Findings are consistent with pneumonia and may reflect aspiration. No associated pulmonary edema or pleural effusions. No pneumothorax identified. There is a component of bronchial obstruction, likely reflecting mucous plug formation in the medial and posterior right lower lobe. Musculoskeletal: Spondylosis of the thoracic spine. No bony lesions or fractures identified. Review of the MIP images confirms the above findings. CT ABDOMEN and PELVIS FINDINGS Hepatobiliary: Stable hepatic cysts. The largest remains at the dome of the liver measures approximately 2.4 cm. No evidence of hepatic masses or biliary obstruction. The gallbladder is unremarkable. Pancreas: Unremarkable. No pancreatic ductal dilatation or surrounding inflammatory changes. Spleen: Normal in size without focal abnormality. Adrenals/Urinary Tract: Adrenal glands are unremarkable. Kidneys are normal, without renal calculi, focal lesion, or hydronephrosis. Bladder is unremarkable and decompressed. Stomach/Bowel: There is a small hiatal hernia. The stomach contains a moderate amount of fluid. There is massive gaseous dilatation of much of the colon with maximal colonic diameter of approximately 11.8 cm. As the colon is followed by CT, there is a transition at the level of the sigmoid colon with decompression of the distal sigmoid and rectum. There is a swirling appearance of the proximal sigmoid and adjacent vessels within the mesentery and findings are suspicious for a sigmoid volvulus. No associated free air or pneumatosis identified. The small bowel is decompressed. No abscess is identified. Vascular/Lymphatic: No enlarged lymph nodes are seen. The  abdominal aorta is calcified and without evidence of aneurysm. Reproductive: Unremarkable. Other: Small left inguinal hernia contains a short segment of nondilated small bowel. There is no evidence to suggest incarceration. Musculoskeletal: Bony structures show spondylosis of the lumbar spine. Review of the MIP images confirms the above findings. IMPRESSION: 1. Evidence of bilateral pneumonia, right greater than left. Findings may reflect aspiration. 2. Coronary and aortic atherosclerosis. No evidence of aortic aneurysm. 3. Findings by CT concerning for sigmoid volvulus with significant gaseous distention of the colon proximal to a level of relative beaking of the sigmoid colon and decompression of the sigmoid and rectum distal to the transition point. Recommend surgical and gastroenterology consultation. 4. These results will be called to the ordering clinician or representative by the Radiologist Assistant, and communication documented in the PACS or zVision Dashboard. Electronically Signed   By: Irish Lack M.D.   On: 04/21/2016 13:19   Dg Chest Portable 1 View  Result Date: 04/20/2016 CLINICAL DATA:  Dyspnea since this evening. History of dementia, infarct, hypertension and pacemaker all. EXAM: PORTABLE CHEST 1 VIEW COMPARISON:  12/25/2015. FINDINGS: Cardiomegaly with tortuous ectatic appearing thoracic aorta. Right ventricular pacing wire is noted with left-sided pacemaker apparatus. Skin fold artifact along the periphery of the right hemithorax. No pneumonic consolidation, CHF, pneumothorax nor effusion. Moderate colonic distention below the diaphragm up to 8 cm possibly representing pseudo-obstruction. No suspicious osseous abnormalities. IMPRESSION: No acute cardiopulmonary disease. Cardiomegaly. Skin fold artifact along the periphery of the right thorax. Electronically Signed   By: Tollie Eth M.D.   On: 04/20/2016 22:07    Anti-infectives: Anti-infectives    Start     Dose/Rate Route Frequency  Ordered Stop   04/22/16 0600  vancomycin (VANCOCIN) IVPB 750 mg/150 ml premix     750 mg 150 mL/hr over 60 Minutes Intravenous Every 12 hours 04/21/16 1351     04/21/16 1500  vancomycin (VANCOCIN) 1,250 mg in sodium  chloride 0.9 % 250 mL IVPB     1,250 mg 166.7 mL/hr over 90 Minutes Intravenous  Once 04/21/16 1351 04/21/16 1840   04/21/16 1400  ceFEPIme (MAXIPIME) 1 g in dextrose 5 % 50 mL IVPB  Status:  Discontinued     1 g 100 mL/hr over 30 Minutes Intravenous Every 8 hours 04/21/16 1343 04/21/16 1349   04/21/16 1400  ceFEPIme (MAXIPIME) 1 g in dextrose 5 % 50 mL IVPB     1 g 100 mL/hr over 30 Minutes Intravenous Every 24 hours 04/21/16 1349 04/29/16 1359      Assessment/Plan: s/p Procedure(s): FLEXIBLE SIGMOIDOSCOPY (N/A)  Will check an abdominal xray this morning to evaluate the volvulus.   LOS: 1 day    Arjuna Doeden A 04/22/2016

## 2016-04-22 NOTE — Progress Notes (Signed)
Insurance check completed for Noac (Xarelto vs Eliquis) S/S NIKKI @ [facility] # [phone]   XARELTO 20 MG DAILY  COVER- YES  CO-PAY- $ 47.00  TIER- 2 DRUG  PRIOR APPROVAL - NO   ELIQUIS 5 MG BID  COVER- YES  CO-PAY- $ 47.00  TIER- 2 DRUG  PRIOR APPROVAL- NO   [facility] : [facility] , [facility]   FOR A 90 DAY SUPPLY RETAIL $ 141.00

## 2016-04-22 NOTE — Evaluation (Signed)
Clinical/Bedside Swallow Evaluation Patient Details  Name: Edwin Wilson MRN: [id number] Date of Birth: 07-01-1930  Today's Date: 04/22/2016 Time: SLP Start Time (ACUTE ONLY): 0940 SLP Stop Time (ACUTE ONLY): 0958 SLP Time Calculation (min) (ACUTE ONLY): 18 min  Past Medical History:  Past Medical History:  Diagnosis Date  . Atrial fibrillation (HCC)   . BPV (benign positional vertigo)   . Bradycardia   . Dementia   . High cholesterol   . Hypertension   . Seizures (HCC)   . Stroke [facility](HCC)    Past Surgical History:  Past Surgical History:  Procedure Laterality Date  . FLEXIBLE SIGMOIDOSCOPY N/A 04/21/2016   Procedure: FLEXIBLE SIGMOIDOSCOPY;  Surgeon: Napoleon FormKavitha V Nandigam, MD;  Location: [facility];  Service: Endoscopy;  Laterality: N/A;  . PACEMAKER INSERTION     HPI:  82 y.o.malewith medical history significant of dementia, HLD, HTN, CVA [facility]with altered mental status, shortness of breath and chest pain. CT of abdomen showed bilateral pneumonia with right greater than left.   Assessment / Plan / Recommendation Clinical Impression  Pt positioned upright in bed before PO intake. SLP attempted to place dentures, but removed because they would not stay in place. Pt has reduced mentation and was implusive when taking sips of liquids. He showed delayed coughing, audible swallows and increased work of breathing during thin liquid trials. Given nectar thick liquid, pt displayed no s/s of aspiration and improved breathing. He had no difficulty with pureed solids. Pt vocal quality remained clear throughout PO trials. Recommend Dys 1 diet and nectar thick liquids via cup. Will f/u for diet tolerance.    Aspiration Risk  Mild aspiration risk    Diet Recommendation Dysphagia 1 (Puree);Nectar-thick liquid   Liquid Administration via: Cup;No straw Medication Administration: Crushed with puree Supervision: Full supervision/cueing for compensatory  strategies Compensations: Minimize environmental distractions;Slow rate;Small sips/bites Postural Changes: Remain upright for at least 30 minutes after po intake;Seated upright at 90 degrees    Other  Recommendations Oral Care Recommendations: Oral care BID Other Recommendations: Order thickener from pharmacy;Prohibited food (jello, ice cream, thin soups);Remove water pitcher   Follow up Recommendations 24 hour supervision/assistance;Skilled Nursing facility;Home health SLP      Frequency and Duration min 2x/week  2 weeks       Prognosis Prognosis for Safe Diet Advancement: Good Barriers to Reach Goals: Cognitive deficits      Swallow Study   General HPI: 82 y.o.malewith medical history significant of dementia, HLD, HTN, CVA who [facility]with altered mental status, shortness of breath and chest pain. CT of abdomen showed bilateral pneumonia with right greater than left. Type of Study: Bedside Swallow Evaluation Previous Swallow Assessment: none in chart Diet Prior to this Study: NPO Temperature Spikes Noted: No Respiratory Status: Room air History of Recent Intubation: No Behavior/Cognition: Alert;Confused;Requires cueing Oral Cavity Assessment: Within Functional Limits Oral Care Completed by SLP: No Oral Cavity - Dentition: Edentulous Self-Feeding Abilities: Total assist Patient Positioning: Upright in bed Baseline Vocal Quality: Normal    Oral/Motor/Sensory Function     Ice Chips Ice chips: Not tested   Thin Liquid Thin Liquid: Impaired Presentation: Cup Pharyngeal  Phase Impairments: Cough - Delayed;Other (comments) (audible swallows)    Nectar Thick Nectar Thick Liquid: Within functional limits Presentation: Cup   Honey Thick Honey Thick Liquid: Not tested   Puree Puree: Within functional limits Presentation: Spoon   Solid   GO   Solid: Not tested       [facility]  Jearl Klinefelter, Student SLP  Caryl Never 04/22/2016,11:07 AM

## 2016-04-23 ENCOUNTER — Other Ambulatory Visit (HOSPITAL_COMMUNITY): Payer: Medicare Other

## 2016-04-23 DIAGNOSIS — I484 Atypical atrial flutter: Secondary | ICD-10-CM

## 2016-04-23 LAB — CBC
HCT: 36.1 % — ABNORMAL LOW (ref 39.0–52.0)
Hemoglobin: 11.6 g/dL — ABNORMAL LOW (ref 13.0–17.0)
MCH: 29.9 pg (ref 26.0–34.0)
MCHC: 32.1 g/dL (ref 30.0–36.0)
MCV: 93 fL (ref 78.0–100.0)
PLATELETS: 143 10*3/uL — AB (ref 150–400)
RBC: 3.88 MIL/uL — ABNORMAL LOW (ref 4.22–5.81)
RDW: 15.2 % (ref 11.5–15.5)
WBC: 13 10*3/uL — AB (ref 4.0–10.5)

## 2016-04-23 LAB — BASIC METABOLIC PANEL
ANION GAP: 5 (ref 5–15)
BUN: 33 mg/dL — ABNORMAL HIGH (ref 6–20)
CALCIUM: 9.5 mg/dL (ref 8.9–10.3)
CO2: 23 mmol/L (ref 22–32)
Chloride: 113 mmol/L — ABNORMAL HIGH (ref 101–111)
Creatinine, Ser: 1.38 mg/dL — ABNORMAL HIGH (ref 0.61–1.24)
GFR, EST AFRICAN AMERICAN: 53 mL/min — AB (ref >60)
GFR, EST NON AFRICAN AMERICAN: 46 mL/min — AB (ref >60)
GLUCOSE: 109 mg/dL — AB (ref 65–99)
Potassium: 4.1 mmol/L (ref 3.5–5.1)
Sodium: 141 mmol/L (ref 135–145)

## 2016-04-23 LAB — GLUCOSE, CAPILLARY
GLUCOSE-CAPILLARY: 103 mg/dL — AB (ref 65–99)
Glucose-Capillary: 108 mg/dL — ABNORMAL HIGH (ref 65–99)

## 2016-04-23 LAB — MAGNESIUM: Magnesium: 1.6 mg/dL — ABNORMAL LOW (ref 1.7–2.4)

## 2016-04-23 MED ORDER — LEVETIRACETAM 100 MG/ML PO SOLN
500.0000 mg | Freq: Two times a day (BID) | ORAL | Status: DC
  Administered 2016-04-23 – 2016-04-24 (×2): 500 mg via ORAL
  Filled 2016-04-23 (×4): qty 5

## 2016-04-23 MED ORDER — MAGNESIUM SULFATE IN D5W 1-5 GM/100ML-% IV SOLN
1.0000 g | Freq: Once | INTRAVENOUS | Status: AC
  Administered 2016-04-23: 1 g via INTRAVENOUS
  Filled 2016-04-23: qty 100

## 2016-04-23 MED ORDER — AMOXICILLIN-POT CLAVULANATE 500-125 MG PO TABS
1.0000 | ORAL_TABLET | Freq: Two times a day (BID) | ORAL | Status: DC
  Administered 2016-04-23 – 2016-04-24 (×3): 500 mg via ORAL
  Filled 2016-04-23 (×5): qty 1

## 2016-04-23 MED ORDER — RIVAROXABAN 15 MG PO TABS
15.0000 mg | ORAL_TABLET | Freq: Every day | ORAL | Status: DC
  Administered 2016-04-23: 15 mg via ORAL
  Filled 2016-04-23: qty 1

## 2016-04-23 NOTE — Progress Notes (Signed)
Speech Language Pathology Treatment: Dysphagia  Patient Details Name: Edwin Wilson MRN: [id number] DOB: [birth year] Today's Date: 04/23/2016 Time: [id number] SLP Time Calculation (min) (ACUTE ONLY): 16 min  Assessment / Plan / Recommendation Clinical Impression  Pt seen with lunch meal tray. He displayed no difficulty with pureed and nectar thick liquids at bedside. Pt was moderately impulsive while drinking large nectar thick liquid trials; however, no overt s/s of aspiration were observed. He had some belching once PO trials were complete. Recommend continuation of Dys 1 and nectar thick liquids. Will f/u for diet advancement.   HPI HPI: 82 y.o.malewith medical history significant of dementia, HLD, HTN, CVA who [facility]with altered mental status, shortness of breath and chest pain. CT of abdomen showed bilateral pneumonia with right greater than left.      SLP Plan  Continue with current plan of care     Recommendations  Diet recommendations: Dysphagia 1 (puree);Nectar-thick liquid Liquids provided via: Cup;No straw Medication Administration: Crushed with puree Supervision: Patient able to self feed;Full supervision/cueing for compensatory strategies Compensations: Minimize environmental distractions;Slow rate;Small sips/bites Postural Changes and/or Swallow Maneuvers: Seated upright 90 degrees;Upright 30-60 min after meal                Oral Care Recommendations: Oral care BID Follow up Recommendations: 24 hour supervision/assistance;Home health SLP;Outpatient SLP Plan: Continue with current plan of care       GO               Tollie EthHaleigh Ragan Eugean Arnott, Student SLP  Caryl NeverHaleigh R Javis Abboud 04/23/2016, 3:20 PM

## 2016-04-23 NOTE — Progress Notes (Signed)
TELEMETRY: Reviewed telemetry pt in Atrial fibrillation/flutter with controlled rate and intermittent V pacing.: Vitals:   04/22/16 0700 04/22/16 1721 04/22/16 1928 04/23/16 0408  BP:  109/82 106/66 137/83  Pulse:  75 68 73  Resp:   20 20  Temp:  97.1 F (36.2 C) 97.5 F (36.4 C) 97.8 F (36.6 C)  TempSrc:  Oral Oral Oral  SpO2:  100% 99% 99%  Weight: 147 lb 9.6 oz (67 kg)   151 lb 12.8 oz (68.9 kg)  Height:        Intake/Output Summary (Last 24 hours) at 04/23/16 0923 Last data filed at 04/22/16 1825  Gross per 24 hour  Intake             3205 ml  Output              300 ml  Net             2905 ml   Filed Weights   04/21/16 0235 04/22/16 0700 04/23/16 0408  Weight: 205 lb 7.5 oz (93.2 kg) 147 lb 9.6 oz (67 kg) 151 lb 12.8 oz (68.9 kg)    Subjective  Feels well this morning. Had a BM this morning, and bowel sounds present this morning.   Marland Kitchen. amLODipine  10 mg Oral Daily  . aspirin EC  81 mg Oral Daily  . atorvastatin  80 mg Oral Daily  . bimatoprost  1 drop Both Eyes QHS  . brimonidine  1 drop Both Eyes Q12H   And  . timolol  1 drop Both Eyes BID  . carvedilol  25 mg Oral BID WC  . ceFEPime (MAXIPIME) IV  1 g Intravenous Q24H  . clopidogrel  75 mg Oral Daily  . donepezil  10 mg Oral QHS  . dorzolamide-timolol  1 drop Both Eyes BID  . famotidine  20 mg Oral BID  . insulin aspart  0-5 Units Subcutaneous QHS  . insulin aspart  0-9 Units Subcutaneous TID WC  . latanoprost  1 drop Both Eyes QHS  . levETIRAcetam  500 mg Oral BID  . lisinopril  40 mg Oral Daily  . potassium chloride SA  40 mEq Oral BID WC  . vancomycin  750 mg Intravenous Q12H   . sodium chloride 50 mL/hr at 04/22/16 1718    LABS: Basic Metabolic Panel:  Recent Labs  16/04/9609/03/17 1420 04/22/16 0039 04/23/16 0338  NA  --  141 141  K  --  4.4 4.1  CL  --  109 113*  CO2  --  21* 23  GLUCOSE  --  100* 109*  BUN  --  23* 33*  CREATININE  --  1.34* 1.38*  CALCIUM  --  9.7 9.5  MG 1.5*  --   --    PHOS 2.8  --   --    Liver Function Tests:  Recent Labs  04/20/16 2110 04/22/16 0039  AST 27 29  ALT 25 22  ALKPHOS 77 52  BILITOT 0.6 1.1  PROT 8.9* 6.9  ALBUMIN 4.0 2.8*   No results for input(s): LIPASE, AMYLASE in the last 72 hours. CBC:  Recent Labs  04/20/16 2110  04/22/16 0039 04/23/16 0338  WBC 7.0  < > 14.7* 13.0*  NEUTROABS 5.4  --   --   --   HGB 14.0  < > 13.1 11.6*  HCT 41.7  < > 39.9 36.1*  MCV 91.9  < > 92.6 93.0  PLT 212  < >  177 143*  < > = values in this interval not displayed. Cardiac Enzymes:  Recent Labs  04/21/16 0433 04/21/16 0910 04/21/16 1420  TROPONINI <0.03 <0.03 <0.03  <0.03   BNP: No results for input(s): PROBNP in the last 72 hours. D-Dimer: No results for input(s): DDIMER in the last 72 hours. Hemoglobin A1C:  Recent Labs  04/21/16 1420  HGBA1C 5.8*   Fasting Lipid Panel: No results for input(s): CHOL, HDL, LDLCALC, TRIG, CHOLHDL, LDLDIRECT in the last 72 hours. Thyroid Function Tests:  Recent Labs  04/21/16 1420  TSH 1.491     Radiology/Studies:   Dg Abd Portable 1v  Result Date: 04/22/2016 CLINICAL DATA:  Abdominal distention. Volvulus status post endoscopic decompression yesterday. EXAM: PORTABLE ABDOMEN - 1 VIEW COMPARISON:  CT 04/21/2016 FINDINGS: Diffuse gaseous distention of the colon, decreased slightly since prior CT. No free air organomegaly. Lung bases are clear. No acute bony abnormality. IMPRESSION: Slight improvement in diffuse gaseous distention of the colon since prior study. Electronically Signed   By: Charlett Nose M.D.   On: 04/22/2016 12:51    PHYSICAL EXAM General: thin, frail, male in no acute distress Head: normal. No JVD Lungs: Resp regular and unlabored, CTA. Heart: IRRR no s3, s4, or murmurs..   Neck: No carotid bruits. No lymphadenopathy.  JVD. Abdomen: Bowel sounds present, abdomen soft and non-tender without masses or hernias noted. Extremities: No clubbing, cyanosis or edema.  DP/PT/Radials 2+ and equal bilaterally. Neuro: Alert and oriented X 2. No focal deficits noted. Skin: No rashes or lesions noted.  ASSESSMENT AND PLAN: 1. Atrial fibrillation and atypical atrial flutter. Duration unknown. Rate controlled now on coreg. Stopped diltiazem yesterday. [place] Vasc score of 5. Case management addressed cost of both Xarelto and Eliquis yesterday. No cost difference between the 2 medications, may prefer Xarelto given once daily dosing. Once on anticoagulation will need to stop ASA and Plavix.  -- 2D echo pending  2. History of bradycardia s/p pacemaker. [employer]. Pacemaker was interrogated yesterday, VVI device, no atrial sensing noted. Unable to determine how long he has been in atrial fibrillation. Normal pacemaker function and good battery life.  3. HTN poorly controlled.  Now on amlodipine, lisinopril and coreg. Now much improved.   4. Sigmoid volvulus. S/p decompression. Stable from surgical standpoint.   Present on Admission: . Chest pain . Volvulus [facility])   Signed, Laverda Page, NP-C 04/23/2016 9:23 AM  Patient seen and examined and history reviewed. Agree with above findings and plan. Doing well today. HR well controlled and pacemaker check OK. I suspect he has chronic Afib/flutter. BP is well controlled. Would recommend switch to NOAC and DC ASA and Plavix. Echo ordered for baseline but otherwise stable for DC from our standpoint.   Lanaysia Fritchman [place], MDFACC 04/23/2016 9:55 AM

## 2016-04-23 NOTE — Discharge Instructions (Addendum)
Information on my medicine - XARELTO (Rivaroxaban)  This medication education was reviewed with me or my healthcare representative as part of my discharge preparation.  The pharmacist that spoke with me during my hospital stay was:  Elwin Sleight, [facility]  Why was Xarelto prescribed for you? Xarelto was prescribed for you to reduce the risk of a blood clot forming that can cause a stroke if you have a medical condition called atrial fibrillation (a type of irregular heartbeat).  What do you need to know about xarelto ? Take your Xarelto ONCE DAILY at the same time every day with your evening meal. If you have difficulty swallowing the tablet whole, you may crush it and mix in applesauce just prior to taking your dose.  Take Xarelto exactly as prescribed by your doctor and DO NOT stop taking Xarelto without talking to the doctor who prescribed the medication.  Stopping without other stroke prevention medication to take the place of Xarelto may increase your risk of developing a clot that causes a stroke.  Refill your prescription before you run out.  After discharge, you should have regular check-up appointments with your healthcare provider that is prescribing your Xarelto.  In the future your dose may need to be changed if your kidney function or weight changes by a significant amount.  What do you do if you miss a dose? If you are taking Xarelto ONCE DAILY and you miss a dose, take it as soon as you remember on the same day then continue your regularly scheduled once daily regimen the next day. Do not take two doses of Xarelto at the same time or on the same day.   Important Safety Information A possible side effect of Xarelto is bleeding. You should call your healthcare provider right away if you experience any of the following: ? Bleeding from an injury or your nose that does not stop. ? Unusual colored urine (red or dark brown) or unusual colored stools (red or black). ? Unusual  bruising for unknown reasons. ? A serious fall or if you hit your head (even if there is no bleeding).  Some medicines may interact with Xarelto and might increase your risk of bleeding while on Xarelto. To help avoid this, consult your healthcare provider or pharmacist prior to using any new prescription or non-prescription medications, including herbals, vitamins, non-steroidal anti-inflammatory drugs (NSAIDs) and supplements.  This website has more information on Xarelto: [email or web address].br.    Volvulus Volvulus is an abnormal twisting of a portion of your digestive tract. Your digestive tract consists of your swallowing tube (esophagus), followed by your stomach, small intestine, and large intestine. This twisting can block the flow of digestion (intestinal obstruction). It can also block the blood flow to the part of the digestive tract that is twisted. Lack of blood flow can cause the twisted part of the digestive tract to die. Volvulus is a medical emergency. There are various types of volvulus:  Sigmoid volvulus is a twisting of the last part of your large intestine. This is the most common type.  Midgut volvulus usually occurs in children who are born with an abnormally positioned small intestine (malrotation).  Cecal volvulus may be caused by scar tissue from previous abdominal surgery.  Gastric volvulus is a rare type of volvulus that occurs when the stomach twists around itself. CAUSES  Volvulus may be caused by many different things. It can be something you are born with (congenital deformity), or it may be a problem that develops  from another condition. RISK FACTORS You may have a greater risk of volvulus if you:  Are 83 years of age or older.  Have long-standing (chronic) constipation.  Have part of your stomach located above the area where the stomach and esophagus meet (paraesophageal hernia).  Are bedridden.  Have had previous abdominal surgery.  Live in a  long-term care facility. SIGNS AND SYMPTOMS  Signs and symptoms of most types of volvulus include:  Abdominal pain.  Sigmoid volvulus may cause pain in the lower left part of the abdomen.  Cecal volvulus may cause pain in the lower right part of the abdomen.  Gastric and midgut volvulus may cause pain in the upper abdomen.  Bloating and swelling of the abdomen.  Decreased passing of gas or inability to pass gas.  Nausea.  Vomiting.  Constipation.  Tenderness when pressing on the abdomen. As the condition gets worse, the volvulus can develop a hole (perforation) and leak digestive contents into the abdomen. This can cause late symptoms of volvulus, including:  Severe infection (sepsis).  Bleeding into the abdomen.  Very low blood pressure (shock). DIAGNOSIS  Your health care provider may suspect volvulus if you have sudden signs and symptoms of intestinal obstruction. A physical exam will be done. The health care provider will listen to your abdomen for the sounds of digestion and will feel your abdomen for tenderness. Imaging studies of your abdomen may also be done. These may include:  CT scan. This is the best imaging study for diagnosing volvulus.  Plain X-rays. These may show air and fluid levels and widening above the obstruction.  Ultrasound. TREATMENT  Volvulus is almost always a medical emergency requiring immediate surgery. A surgeon may attempt to do a procedure to untwist the volvulus if possible. If the volvulus cannot be untwisted, the part of the digestive tract involved may need to be removed.   This information is not intended to replace advice given to you by your health care provider. Make sure you discuss any questions you have with your health care provider.   Document Released: 03/31/2001 Document Revised: 07/27/2014 Document Reviewed: 03/21/2014 Elsevier Interactive Patient Education 2016 Else[employer] Inc.   Atrial Flutter Atrial flutter is a heart  rhythm that can cause the heart to beat very fast (tachycardia). It originates in the upper chambers of the heart (atria). In atrial flutter, the top chambers of the heart (atria) often beat much faster than the bottom chambers of the heart (ventricles). Atrial flutter has a regular "saw toothed" appearance in an EKG readout. An EKG is a test that records the electrical activity of the heart. Atrial flutter can cause the heart to beat up to 150 beats per minute (BPM). Atrial flutter can either be short lived (paroxysmal) or permanent.  CAUSES  Causes of atrial flutter can be many. Some of these include:  Heart related issues:  Heart attack (myocardial infarction).  Heart failure.  Heart valve problems.  Poorly controlled high blood pressure (hypertension).  After open heart surgery.  Lung related issues:  A blood clot in the lungs (pulmonary embolism).  Chronic obstructive pulmonary disease (COPD). Medications used to treat COPD can attribute to atrial flutter.  Other related causes:  Hyperthyroidism.  Caffeine.  Some decongestant cold medications.  Low electrolyte levels such as potassium or magnesium.  Cocaine. SYMPTOMS  An awareness of your heart beating rapidly (palpitations).  Shortness of breath.  Chest pain.  Low blood pressure (hypotension).  Dizziness or fainting. DIAGNOSIS  Different tests can  be performed to diagnose atrial flutter.   An EKG.  Holter monitor. This is a 24-hour recording of your heart rhythm. You will also be given a diary. Write down all symptoms that you have and what you were doing at the time you experienced symptoms.  Cardiac event monitor. This small device can be worn for up to 30 days. When you have heart symptoms, you will push a button on the device. This will then record your heart rhythm.  Echocardiogram. This is an imaging test to look at your heart. Your caregiver will look at your heart valves and the ventricles.  Stress  test. This test can help determine if the atrial flutter is related to exercise or if coronary artery disease is present.  Laboratory studies will look at certain blood levels like:  Complete blood count (CBC).  Potassium.  Magnesium.  Thyroid function. TREATMENT  Treatment of atrial flutter varies. A combination of therapies may be used or sometimes atrial flutter may need only 1 type of treatment.  Lab work: If your blood work, such as your electrolytes (potassium, magnesium) or your thyroid function tests, are abnormal, your caregiver will treat them accordingly.  Medication:  There are several different types of medications that can convert your heart to a normal rhythm and prevent atrial flutter from reoccurring.  Nonsurgical procedures: Nonsurgical techniques may be used to control atrial flutter. Some examples include:  Cardioversion. This technique uses either drugs or an electrical shock to restore a normal heart rhythm:  Cardioversion drugs may be given through an intravenous (IV) line to help "reset" the heart rhythm.  In electrical cardioversion, your caregiver shocks your heart with electrical energy. This helps to reset the heartbeat to a normal rhythm.  Ablation. If atrial flutter is a persistent problem, an ablation may be needed. This procedure is done under mild sedation. High frequency radio-wave energy is used to destroy the area of heart tissue responsible for atrial flutter. SEEK IMMEDIATE MEDICAL CARE IF:  You have:  Dizziness.  Near fainting or fainting.  Shortness of breath.  Chest pain or pressure.  Sudden nausea or vomiting.  Profuse sweating. If you have the above symptoms, call your local emergency service immediately! Do not drive yourself to the hospital. MAKE SURE YOU:   Understand these instructions.  Will watch your condition.  Will get help right away if you are not doing well or get worse.   This information is not intended to  replace advice given to you by your health care provider. Make sure you discuss any questions you have with your health care provider.   Document Released: 11/22/2008 Document Revised: 07/27/2014 Document Reviewed: 01/18/2015 Elsevier Interactive Patient Education 2016 Elsevier Inc.   Dysphagia Diet Level 1, Pureed The dysphasia level 1 diet includes foods that are completely pureed and smooth. The foods have a pudding-like texture, such as the texture of pureed pancakes, mashed potatoes, and yogurt. The diet does not include foods with lumps or coarse textures. Liquids should be smooth and may either be thin, nectar-thick, honey-like, or spoon-thick. This diet is helpful for people with moderate to severe swallowing problems. It reduces the risk of food getting caught in the windpipe, trachea, or lungs. You may need help or supervision during meals while following this diet. WHAT DO I NEED TO KNOW ABOUT THIS DIET? Foods  You may eat foods that are soft and have a pudding-like texture. If a food does not have this texture, you may be able to  eat the food after:  Pureeing it. This can be done with a blender or whisk.  Moistening it with liquid. For example, you may have bread if you soak it in milk or syrup.  Avoid foods that are hard, dry, sticky, chunky, lumpy, or stringy. Also avoid foods with nuts, seeds, raisins, skins, and pulp.  Do not eat foods that you have to chew. If you have to chew the food, then you cannot eat it.  Eat a variety of foods to get all the nutrients you need. Liquids  You may drink liquids that are smooth. Your health care provider will tell you if you should drink thin or thickened liquids.  To thicken a liquid, use a food and beverage thickener or a thickening food. Thickened liquids are usually a "pudding-like" consistency.  Thin liquids include fruit juices, milk, coffee, tea, yogurts, shakes, and similar foods that melt to thin liquid at room  temperature.  Avoid liquids with seeds, pulp, or chunks. See your dietitian or health care provider regularly for help with your dietary changes. WHAT FOODS CAN I EAT? Grains Store-bought soft breads, pancakes, and [place] toast that have a smooth, moist texture and do not have nuts or seeds (you will need to moisten the food with liquid). Cooked cereals that have a pudding-like consistency, such as cream of wheat or farina (no oatmeal). Pureed, well-cooked pasta, rice, and plain bread stuffing. Vegetables Pureed vegetables. Soft avocado. Smooth tomato paste or sauce. Strained or pureed soups (these may need to be thickened as directed). Mashed or pureed potatoes without skin (can be seasoned with butter, smooth gravy, margarine, or sour cream). Fruits Pureed fruits such as melons and apples without seeds or pulp. Mashed bananas. Smooth tomato paste or sauce. Fruit juices without pulp or seeds. Strained or pureed soups. Meat and Other Protein Sources Pureed meat. Smooth pate or liverwurst. Smooth souffles. Pureed beans (such as lentils). Pureed eggs. Dairy Yogurt. Smooth cheese sauces. Milk (may need to be thickened). Nutritional dairy drinks or shakes. Ask your health care provider whether you can have ice cream. Condiments Finely ground salt, pepper, and other ground spices. Sweets/Desserts Smooth puddings and custards. Pureed desserts. Souffles. Whipped topping. Ask your health care provider whether you can have frozen desserts. Fats and Oils Butter. Margarine. Smooth and strained gravy. Sour cream. Mayonnaise. Cream cheese. Whipped topping. Smooth sauces (such as white sauce, cheese sauce, or hollandaise sauce). The items listed above may not be a complete list of recommended foods or beverages. Contact your dietitian for more options. WHAT FOODS ARE NOT RECOMMENDED? Grains Oatmeal. Dry cereals. Hard breads. Vegetables Whole vegetables. Stringy vegetables (such as celery). Thin tomato  sauce. Fruits Whole fresh, frozen, canned, or dried fruits that have not been pureed. Stringy fruits (such as pineapple). Meat and Other Protein Sources Whole or ground meat, fish, or poultry. Dried or cooked lentils or legumes that have been cooked but not mashed or pureed. Non-pureed eggs. Nuts and seeds. Peanut butter. Dairy Non-pureed cheese. Dairy products with lumps or chunks. Ask your health care provider whether you can have ice cream. Condiments Coarse or seeded herbs and spices. Sweets/Desserts [place] preserves. Jams with seeds. Solid desserts. Sticky, chewy sweets (such as licorice and caramel). Ask your health care provider whether you can have frozen desserts. Fats and Oils Sauces of fats with lumps or chunks. The items listed above may not be a complete list of foods and beverages to avoid. Contact your dietitian for more information.   This information  is not intended to replace advice given to you by your health care provider. Make sure you discuss any questions you have with your health care provider.   Document Released: 07/06/2005 Document Revised: 07/27/2014 Document Reviewed: 06/19/2013 Elsevier Interactive Patient Education [employer] Inc.

## 2016-04-23 NOTE — Progress Notes (Signed)
PROGRESS NOTE  Edwin Wilson  ZOX:[id number] DOB: [birth year]  DOA: 04/20/2016 PCP: Patria Mane, MD   Brief Narrative:  82 year old male with PMH of advanced dementia, HLD, HTN, CVA, A. fib presented to [facility] ED on 04/20/16 with complaints of dyspnea, sweating, chest and abdominal pain. Patient unable to provide history secondary to advanced dementia and no family were available at that time. He lived at an assisted living facility until approximately a week prior to admission when he was brought home. In the ED, had a single episode of bloody emesis although it was not clear if this was emesis or hemoptysis. Also noted to have atrial flutter, acute kidney injury.   Assessment & Plan:   Active Problems:   Chest pain   Shortness of breath   Essential hypertension   Dementia with behavioral disturbance   Seizures (HCC)   BPV (benign positional vertigo)   Hyperglycemia   Volvulus (HCC)   Abdominal distension   Sigmoid volvulus (HCC)   Atypical atrial flutter (HCC)   Chest pain/dyspnea - Resolved without recurrence. Patient has advanced dementia and his history is unreliable. - Troponin 5: Negative.  Abdominal pain/sigmoid volvulus - [facility] was consulted and patient underwent emergent flexible sigmoidoscopy with decompression on 10/3. General surgery consultation appreciated: Not a good surgical candidate but consideration if volvulus recurs. General surgery signed off on 04/23/16. Patient tolerating diet and having BM. - Abdominal x-ray 04/22/16 shows slight improvement.  Hematemesis versus ? hemoptysis  - Single episode in ED on 10/3. No recurrence.  [facility] follow-up appreciated. Stopped IV PPI infusion and continue Pepcid twice a day - As per discussion with [facility]/Dr. Lavon Paganini: Not even sure if he truly had hematemesis and even if he did have, may have been small related to his sigmoid volvulus & was OK to start [facility] with NOAC without need for any further  workup.  Possible healthcare associated pneumonia versus aspiration pneumonia - Placed on vancomycin and Maxipime. Improved. Blood cultures 2 negative to date. Transition to oral Augmentin. Complete total 7 days of treatment.  Atrial fibrillation and atypical Atrial flutter/status post previous pacemaker for bradycardia - Cardiology consultation and follow-up appreciated. Duration unknown. Cardizem was changed to carvedilol for better rate control. - [place] Vasc score of 5.  - Cardiology plans to interrogate PM -no significant issues - Discussed with Dr. [place] who recommends NOAC/Xarelto and DC antiplatelets. As discussed with [facility], okay to start anticoagulation.  Essential hypertension - Uncontrolled. Currently on amlodipine, lisinopril and Cardizem. Cardiology stopped Cardizem and started carvedilol on 10/4 for rate control. Controlled.  Acute on possible stage III chronic kidney disease - Baseline creatinine is not known. Presented with creatinine of 1.5 which is improved to 1.3. Baseline creatinine may be at 1.3.  Advanced dementia - Confusion overnight and wanting to get out of bed. Likely sundowning-ongoing. Pleasant and cooperative this morning.  Seizure disorder - Keppra  Hyperglycemia without history of DM  History of CVA - See discussion above regarding anticoagulation  Dysphagia - Seen with speech therapy in room. Starting dysphagia 3 diet.  Anemia - ? Dilutional. Follow CBC in a.m. No overt bleeding reported.   DVT prophylaxis: start NOAC 10/5 Code Status: Full Family Communication: None at bedside Disposition Plan: DC home possibly 10/6.   Consultants:   [facility]  Gen. Surgery  Cardiology  Procedures:   Flexible sigmoidoscopy 04/21/16: Impression:               - Volvulus. Successful complete  decompression                            achieved.                           - No specimens collected. Moderate Sedation:      Moderate (conscious)  sedation was administered by the endoscopy nurse       and supervised by the endoscopist. The following parameters were       monitored: oxygen saturation, heart rate, blood pressure, and response       to care. Total physician intraservice time was 12 minutes. Recommendation:           - Use Benefiber two teaspoons PO TID.                           - Dulcolax 1-3 tabs or caps PO daily to have 1-2                            soft BM per day.                           - Advance diet as tolerated.                           - Continue present medications.                           -Out of bed to chair and ambulate as tolerated                           - Consult surgery for possible sigmoid pexy to                            prevent recurrence                           -Will sign off, available for any questions  Antimicrobials:   IV Cefepime  IV Vancomycin    Subjective: Pleasantly confused. Unable to provide any history. As per RN, no acute issues. No further bleeding reported.   Objective:  Vitals:   04/22/16 0700 04/22/16 1721 04/22/16 1928 04/23/16 0408  BP:  109/82 106/66 137/83  Pulse:  75 68 73  Resp:   20 20  Temp:  97.1 F (36.2 C) 97.5 F (36.4 C) 97.8 F (36.6 C)  TempSrc:  Oral Oral Oral  SpO2:  100% 99% 99%  Weight: 67 kg (147 lb 9.6 oz)   68.9 kg (151 lb 12.8 oz)  Height:        Intake/Output Summary (Last 24 hours) at 04/23/16 1301 Last data filed at 04/23/16 1030  Gross per 24 hour  Intake             3205 ml  Output              800 ml  Net             2405 ml   Filed Weights   04/21/16 0235 04/22/16 0700 04/23/16 0408  Weight: 93.2 kg (205 lb  7.5 oz) 67 kg (147 lb 9.6 oz) 68.9 kg (151 lb 12.8 oz)    Examination:  General exam: Frail elderly male, chronically ill-looking, lying comfortably propped up in bed.  Respiratory system: Clear to auscultation. Respiratory effort normal. Cardiovascular system: S1 & S2 heard, RRR. No JVD, murmurs, rubs,  gallops or clicks. No pedal edema. telemetry: A. fib with controlled ventricular rate/intermittent pacemaker spikes.  Gastrointestinal system: Abdomen is nondistended, soft and nontender. No organomegaly or masses felt. Normal bowel sounds heard. Central nervous system: Alert and oriented only to self . No focal neurological deficits. Extremities: Symmetric 5 x 5 power. Resting coarse tremors of left upper extremity with cogwheel rigidity  Skin: No rashes, lesions or ulcers Psychiatry: Judgement and insight impaired. Mood & affect appropriate.     Data Reviewed: I have personally reviewed following labs and imaging studies  CBC:  Recent Labs Lab 04/20/16 2110 04/21/16 0433 04/21/16 0910 04/21/16 1420 04/22/16 0039 04/23/16 0338  WBC 7.0 6.4 6.6 9.3 14.7* 13.0*  NEUTROABS 5.4  --   --   --   --   --   HGB 14.0 14.7 15.8 15.1 13.1 11.6*  HCT 41.7 45.2 47.3 45.5 39.9 36.1*  MCV 91.9 92.8 91.8 92.5 92.6 93.0  PLT 212 211 198 184 177 143*   Basic Metabolic Panel:  Recent Labs Lab 04/20/16 2110 04/21/16 1420 04/22/16 0039 04/23/16 0338  NA 137  --  141 141  K 3.9  --  4.4 4.1  CL 108  --  109 113*  CO2 19*  --  21* 23  GLUCOSE 210*  --  100* 109*  BUN 20  --  23* 33*  CREATININE 1.52*  --  1.34* 1.38*  CALCIUM 10.5*  --  9.7 9.5  MG  --  1.5*  --   --   PHOS  --  2.8  --   --    GFR: Estimated Creatinine Clearance: 39.5 mL/min (by C-G formula based on SCr of 1.38 mg/dL (H)). Liver Function Tests:  Recent Labs Lab 04/20/16 2110 04/22/16 0039  AST 27 29  ALT 25 22  ALKPHOS 77 52  BILITOT 0.6 1.1  PROT 8.9* 6.9  ALBUMIN 4.0 2.8*   No results for input(s): LIPASE, AMYLASE in the last 168 hours. No results for input(s): AMMONIA in the last 168 hours. Coagulation Profile:  Recent Labs Lab 04/21/16 0433 04/21/16 1420  INR 1.28 1.34   Cardiac Enzymes:  Recent Labs Lab 04/20/16 2110 04/21/16 0433 04/21/16 0910 04/21/16 1420  TROPONINI <0.03 <0.03 <0.03  <0.03  <0.03   BNP (last 3 results) No results for input(s): PROBNP in the last 8760 hours. HbA1C:  Recent Labs  04/21/16 1420  HGBA1C 5.8*   CBG:  Recent Labs Lab 04/22/16 1138 04/22/16 1608 04/22/16 2111 04/23/16 0631 04/23/16 1131  GLUCAP 84 106* 117* 108* 103*   Lipid Profile: No results for input(s): CHOL, HDL, LDLCALC, TRIG, CHOLHDL, LDLDIRECT in the last 72 hours. Thyroid Function Tests:  Recent Labs  04/21/16 1420  TSH 1.491  FREET4 1.48*   Anemia Panel: No results for input(s): VITAMINB12, FOLATE, FERRITIN, TIBC, IRON, RETICCTPCT in the last 72 hours.  Sepsis Labs:  Recent Labs Lab 04/21/16 1654  LATICACIDVEN 3.6*    Recent Results (from the past 240 hour(s))  Culture, blood (routine x 2) Call MD if unable to obtain prior to antibiotics being given     Status: None (Preliminary result)   Collection Time: 04/21/16  4:45  PM  Result Value Ref Range Status   Specimen Description BLOOD RIGHT ANTECUBITAL  Final   Special Requests BOTTLES DRAWN AEROBIC ONLY 10CC  Final   Culture NO GROWTH < 24 HOURS  Final   Report Status PENDING  Incomplete  Culture, blood (routine x 2) Call MD if unable to obtain prior to antibiotics being given     Status: None (Preliminary result)   Collection Time: 04/21/16  4:55 PM  Result Value Ref Range Status   Specimen Description BLOOD RIGHT ARM  Final   Special Requests BOTTLES DRAWN AEROBIC ONLY 9CC  Final   Culture NO GROWTH < 24 HOURS  Final   Report Status PENDING  Incomplete         Radiology Studies: Dg Abd Portable 1v  Result Date: 04/22/2016 CLINICAL DATA:  Abdominal distention. Volvulus status post endoscopic decompression yesterday. EXAM: PORTABLE ABDOMEN - 1 VIEW COMPARISON:  CT 04/21/2016 FINDINGS: Diffuse gaseous distention of the colon, decreased slightly since prior CT. No free air organomegaly. Lung bases are clear. No acute bony abnormality. IMPRESSION: Slight improvement in diffuse gaseous distention  of the colon since prior study. Electronically Signed   By: Charlett Nose M.D.   On: 04/22/2016 12:51        Scheduled Meds: . amLODipine  10 mg Oral Daily  . aspirin EC  81 mg Oral Daily  . atorvastatin  80 mg Oral Daily  . bimatoprost  1 drop Both Eyes QHS  . brimonidine  1 drop Both Eyes Q12H   And  . timolol  1 drop Both Eyes BID  . carvedilol  25 mg Oral BID WC  . ceFEPime (MAXIPIME) IV  1 g Intravenous Q24H  . clopidogrel  75 mg Oral Daily  . donepezil  10 mg Oral QHS  . dorzolamide-timolol  1 drop Both Eyes BID  . famotidine  20 mg Oral BID  . insulin aspart  0-5 Units Subcutaneous QHS  . insulin aspart  0-9 Units Subcutaneous TID WC  . latanoprost  1 drop Both Eyes QHS  . levETIRAcetam  500 mg Oral BID  . lisinopril  40 mg Oral Daily  . potassium chloride SA  40 mEq Oral BID WC  . vancomycin  750 mg Intravenous Q12H   Continuous Infusions: . sodium chloride 50 mL/hr at 04/23/16 0957     LOS: 2 days     [facility], MD [facility] Pager [phone]  If 7PM-7AM, please contact night-coverage [email or web address] Password [email or web address] 04/23/2016, 1:01 PM

## 2016-04-23 NOTE — Progress Notes (Signed)
2 Days Post-Op  Subjective: Comfortable Had BM  Objective: Vital signs in last 24 hours: Temp:  [97.1 F (36.2 C)-97.8 F (36.6 C)] 97.8 F (36.6 C) (10/05 0408) Pulse Rate:  [68-75] 73 (10/05 0408) Resp:  [20] 20 (10/05 0408) BP: (106-137)/(66-83) 137/83 (10/05 0408) SpO2:  [99 %-100 %] 99 % (10/05 0408) Weight:  [68.9 kg (151 lb 12.8 oz)] 68.9 kg (151 lb 12.8 oz) (10/05 0408) Last BM Date: 04/23/16  Intake/Output from previous day: 10/04 0701 - 10/05 0700 In: 3205 [I.V.:2805; IV Piggyback:400] Out: 300 [Urine:300] Intake/Output this shift: No intake/output data recorded.  Exam: Abdomen soft, non tender  Lab Results:   Recent Labs  04/22/16 0039 04/23/16 0338  WBC 14.7* 13.0*  HGB 13.1 11.6*  HCT 39.9 36.1*  PLT 177 143*   BMET  Recent Labs  04/22/16 0039 04/23/16 0338  NA 141 141  K 4.4 4.1  CL 109 113*  CO2 21* 23  GLUCOSE 100* 109*  BUN 23* 33*  CREATININE 1.34* 1.38*  CALCIUM 9.7 9.5   PT/INR  Recent Labs  04/21/16 0433 04/21/16 1420  LABPROT 16.1* 16.7*  INR 1.28 1.34   ABG No results for input(s): PHART, HCO3 in the last 72 hours.  Invalid input(s): PCO2, PO2  Studies/Results: Ct Angio Chest Pe W Or Wo Contrast  Result Date: 04/21/2016 CLINICAL DATA:  Shortness of breath, chest pain and abdominal pain. EXAM: CT ANGIOGRAPHY CHEST CT ABDOMEN AND PELVIS WITH CONTRAST TECHNIQUE: Multidetector CT imaging of the chest was performed using the standard protocol during bolus administration of intravenous contrast. Multiplanar CT image reconstructions and MIPs were obtained to evaluate the vascular anatomy. Multidetector CT imaging of the abdomen and pelvis was performed using the standard protocol during bolus administration of intravenous contrast. CONTRAST:  80 mL Isovue 370 IV COMPARISON:  Prior CT of the abdomen and pelvis at [facility] [place] on 11/02/2012 FINDINGS: CTA CHEST FINDINGS Cardiovascular: Pulmonary arterial opacification is  somewhat limited in the peripheral lungs. No pulmonary embolism is identified. The thoracic aorta demonstrates calcified plaque without aneurysmal disease. The heart size is normal. No pericardial fluid identified. Probable calcified plaque in the distribution of the left circumflex coronary artery and also potentially the right coronary artery. Mediastinum/Nodes: No evidence of mediastinal, hilar or axillary lymphadenopathy. No mediastinal masses or fluid collections. The thyroid gland appears unremarkable. Lungs/Pleura: Patchy alveolar airspace disease is noted in the right upper lobe, right middle lobe, right lower lobe and left lower lobe. The most significant involvement is in the right lower lobe. Findings are consistent with pneumonia and may reflect aspiration. No associated pulmonary edema or pleural effusions. No pneumothorax identified. There is a component of bronchial obstruction, likely reflecting mucous plug formation in the medial and posterior right lower lobe. Musculoskeletal: Spondylosis of the thoracic spine. No bony lesions or fractures identified. Review of the MIP images confirms the above findings. CT ABDOMEN and PELVIS FINDINGS Hepatobiliary: Stable hepatic cysts. The largest remains at the dome of the liver measures approximately 2.4 cm. No evidence of hepatic masses or biliary obstruction. The gallbladder is unremarkable. Pancreas: Unremarkable. No pancreatic ductal dilatation or surrounding inflammatory changes. Spleen: Normal in size without focal abnormality. Adrenals/Urinary Tract: Adrenal glands are unremarkable. Kidneys are normal, without renal calculi, focal lesion, or hydronephrosis. Bladder is unremarkable and decompressed. Stomach/Bowel: There is a small hiatal hernia. The stomach contains a moderate amount of fluid. There is massive gaseous dilatation of much of the colon with maximal colonic diameter of approximately  11.8 cm. As the colon is followed by CT, there is a  transition at the level of the sigmoid colon with decompression of the distal sigmoid and rectum. There is a swirling appearance of the proximal sigmoid and adjacent vessels within the mesentery and findings are suspicious for a sigmoid volvulus. No associated free air or pneumatosis identified. The small bowel is decompressed. No abscess is identified. Vascular/Lymphatic: No enlarged lymph nodes are seen. The abdominal aorta is calcified and without evidence of aneurysm. Reproductive: Unremarkable. Other: Small left inguinal hernia contains a short segment of nondilated small bowel. There is no evidence to suggest incarceration. Musculoskeletal: Bony structures show spondylosis of the lumbar spine. Review of the MIP images confirms the above findings. IMPRESSION: 1. Evidence of bilateral pneumonia, right greater than left. Findings may reflect aspiration. 2. Coronary and aortic atherosclerosis. No evidence of aortic aneurysm. 3. Findings by CT concerning for sigmoid volvulus with significant gaseous distention of the colon proximal to a level of relative beaking of the sigmoid colon and decompression of the sigmoid and rectum distal to the transition point. Recommend surgical and gastroenterology consultation. 4. These results will be called to the ordering clinician or representative by the Radiologist Assistant, and communication documented in the PACS or zVision Dashboard. Electronically Signed   By: Irish Lack M.D.   On: 04/21/2016 13:19   Ct Abdomen Pelvis W Contrast  Result Date: 04/21/2016 CLINICAL DATA:  Shortness of breath, chest pain and abdominal pain. EXAM: CT ANGIOGRAPHY CHEST CT ABDOMEN AND PELVIS WITH CONTRAST TECHNIQUE: Multidetector CT imaging of the chest was performed using the standard protocol during bolus administration of intravenous contrast. Multiplanar CT image reconstructions and MIPs were obtained to evaluate the vascular anatomy. Multidetector CT imaging of the abdomen and  pelvis was performed using the standard protocol during bolus administration of intravenous contrast. CONTRAST:  80 mL Isovue 370 IV COMPARISON:  Prior CT of the abdomen and pelvis at [facility] on 11/02/2012 FINDINGS: CTA CHEST FINDINGS Cardiovascular: Pulmonary arterial opacification is somewhat limited in the peripheral lungs. No pulmonary embolism is identified. The thoracic aorta demonstrates calcified plaque without aneurysmal disease. The heart size is normal. No pericardial fluid identified. Probable calcified plaque in the distribution of the left circumflex coronary artery and also potentially the right coronary artery. Mediastinum/Nodes: No evidence of mediastinal, hilar or axillary lymphadenopathy. No mediastinal masses or fluid collections. The thyroid gland appears unremarkable. Lungs/Pleura: Patchy alveolar airspace disease is noted in the right upper lobe, right middle lobe, right lower lobe and left lower lobe. The most significant involvement is in the right lower lobe. Findings are consistent with pneumonia and may reflect aspiration. No associated pulmonary edema or pleural effusions. No pneumothorax identified. There is a component of bronchial obstruction, likely reflecting mucous plug formation in the medial and posterior right lower lobe. Musculoskeletal: Spondylosis of the thoracic spine. No bony lesions or fractures identified. Review of the MIP images confirms the above findings. CT ABDOMEN and PELVIS FINDINGS Hepatobiliary: Stable hepatic cysts. The largest remains at the dome of the liver measures approximately 2.4 cm. No evidence of hepatic masses or biliary obstruction. The gallbladder is unremarkable. Pancreas: Unremarkable. No pancreatic ductal dilatation or surrounding inflammatory changes. Spleen: Normal in size without focal abnormality. Adrenals/Urinary Tract: Adrenal glands are unremarkable. Kidneys are normal, without renal calculi, focal lesion, or hydronephrosis.  Bladder is unremarkable and decompressed. Stomach/Bowel: There is a small hiatal hernia. The stomach contains a moderate amount of fluid. There is massive gaseous dilatation  of much of the colon with maximal colonic diameter of approximately 11.8 cm. As the colon is followed by CT, there is a transition at the level of the sigmoid colon with decompression of the distal sigmoid and rectum. There is a swirling appearance of the proximal sigmoid and adjacent vessels within the mesentery and findings are suspicious for a sigmoid volvulus. No associated free air or pneumatosis identified. The small bowel is decompressed. No abscess is identified. Vascular/Lymphatic: No enlarged lymph nodes are seen. The abdominal aorta is calcified and without evidence of aneurysm. Reproductive: Unremarkable. Other: Small left inguinal hernia contains a short segment of nondilated small bowel. There is no evidence to suggest incarceration. Musculoskeletal: Bony structures show spondylosis of the lumbar spine. Review of the MIP images confirms the above findings. IMPRESSION: 1. Evidence of bilateral pneumonia, right greater than left. Findings may reflect aspiration. 2. Coronary and aortic atherosclerosis. No evidence of aortic aneurysm. 3. Findings by CT concerning for sigmoid volvulus with significant gaseous distention of the colon proximal to a level of relative beaking of the sigmoid colon and decompression of the sigmoid and rectum distal to the transition point. Recommend surgical and gastroenterology consultation. 4. These results will be called to the ordering clinician or representative by the Radiologist Assistant, and communication documented in the PACS or zVision Dashboard. Electronically Signed   By: Irish LackGlenn  Yamagata M.D.   On: 04/21/2016 13:19   Dg Abd Portable 1v  Result Date: 04/22/2016 CLINICAL DATA:  Abdominal distention. Volvulus status post endoscopic decompression yesterday. EXAM: PORTABLE ABDOMEN - 1 VIEW  COMPARISON:  CT 04/21/2016 FINDINGS: Diffuse gaseous distention of the colon, decreased slightly since prior CT. No free air organomegaly. Lung bases are clear. No acute bony abnormality. IMPRESSION: Slight improvement in diffuse gaseous distention of the colon since prior study. Electronically Signed   By: Charlett NoseKevin  Dover M.D.   On: 04/22/2016 12:51    Anti-infectives: Anti-infectives    Start     Dose/Rate Route Frequency Ordered Stop   04/22/16 0600  vancomycin (VANCOCIN) IVPB 750 mg/150 ml premix     750 mg 150 mL/hr over 60 Minutes Intravenous Every 12 hours 04/21/16 1351     04/21/16 1500  vancomycin (VANCOCIN) 1,250 mg in sodium chloride 0.9 % 250 mL IVPB     1,250 mg 166.7 mL/hr over 90 Minutes Intravenous  Once 04/21/16 1351 04/21/16 1840   04/21/16 1400  ceFEPIme (MAXIPIME) 1 g in dextrose 5 % 50 mL IVPB  Status:  Discontinued     1 g 100 mL/hr over 30 Minutes Intravenous Every 8 hours 04/21/16 1343 04/21/16 1349   04/21/16 1400  ceFEPIme (MAXIPIME) 1 g in dextrose 5 % 50 mL IVPB     1 g 100 mL/hr over 30 Minutes Intravenous Every 24 hours 04/21/16 1349 04/29/16 1359      Assessment/Plan: s/p Procedure(s): FLEXIBLE SIGMOIDOSCOPY (N/A)  Sigmoid volvulus  Stable from a surgical standpoint.  Will sign off for now.  Call us back if needed.  LOS: 2 days    Jeniffer Culliver A 04/23/2016

## 2016-04-23 NOTE — Progress Notes (Signed)
ANTICOAGULATION CONSULT NOTE - Initial Consult  Pharmacy Consult for Xarelto Indication: atrial fibrillation  No Known Allergies  Patient Measurements: Height: 6\' 2"  (188 cm) Weight: 151 lb 12.8 oz (68.9 kg) IBW/kg (Calculated) : 82.2  Vital Signs: Temp: 97.7 F (36.5 C) (10/05 1300) Temp Source: Oral (10/05 1300) BP: 126/75 (10/05 1300) Pulse Rate: 60 (10/05 1300)  Labs:  Recent Labs  04/20/16 2110 04/21/16 0433 04/21/16 0910 04/21/16 1420 04/22/16 0039 04/23/16 0338  HGB 14.0 14.7 15.8 15.1 13.1 11.6*  HCT 41.7 45.2 47.3 45.5 39.9 36.1*  PLT 212 211 198 184 177 143*  APTT  --  33  --  33  --   --   LABPROT  --  16.1*  --  16.7*  --   --   INR  --  1.28  --  1.34  --   --   CREATININE 1.52*  --   --   --  1.34* 1.38*  TROPONINI <0.03 <0.03 <0.03 <0.03  <0.03  --   --     Estimated Creatinine Clearance: 39.5 mL/min (by C-G formula based on SCr of 1.38 mg/dL (H)).   Medical History: Past Medical History:  Diagnosis Date  . Atrial fibrillation (HCC)   . BPV (benign positional vertigo)   . Bradycardia   . Dementia   . High cholesterol   . Hypertension   . Seizures (HCC)   . Stroke [facility](HCC)     Assessment: 82 year old male to begin Xarelto for Afib  Goal of Therapy:  Appropriate dosing Monitor platelets by anticoagulation protocol: Yes   Plan:  Xarelto 15 mg po daily Pharmacy to sign off and follow peripherally  Thank you Okey RegalLisa Jihan Mellette, PharmD [phone]  04/23/2016,2:32 PM

## 2016-04-23 NOTE — Care Management Note (Signed)
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   Expected Discharge Date:                  Expected Discharge Plan:  Home/Self Care  In-House Referral:     Discharge planning Services  CM Consult, Medication Assistance  Post Acute Care Choice:    Choice offered to:     DME Arranged:    DME Agency:     HH Arranged:    HH Agency:     Status of Service:  Completed, signed off  If discussed at [employer] Length of Stay Meetings, dates discussed:    Additional Comments:  Darrold SpanWebster, [name], RN 04/23/2016, 2:48 PM

## 2016-04-24 ENCOUNTER — Inpatient Hospital Stay (HOSPITAL_COMMUNITY): Payer: Medicare Other

## 2016-04-24 LAB — CBC
HCT: 36.5 % — ABNORMAL LOW (ref 39.0–52.0)
HEMOGLOBIN: 11.7 g/dL — AB (ref 13.0–17.0)
MCH: 29.9 pg (ref 26.0–34.0)
MCHC: 32.1 g/dL (ref 30.0–36.0)
MCV: 93.4 fL (ref 78.0–100.0)
Platelets: 156 10*3/uL (ref 150–400)
RBC: 3.91 MIL/uL — AB (ref 4.22–5.81)
RDW: 15.3 % (ref 11.5–15.5)
WBC: 12.4 10*3/uL — AB (ref 4.0–10.5)

## 2016-04-24 LAB — GLUCOSE, CAPILLARY
GLUCOSE-CAPILLARY: 94 mg/dL (ref 65–99)
Glucose-Capillary: 149 mg/dL — ABNORMAL HIGH (ref 65–99)

## 2016-04-24 MED ORDER — RIVAROXABAN 15 MG PO TABS: 15.0000 mg | ORAL_TABLET | Freq: Every day | ORAL | 0 refills | Status: AC

## 2016-04-24 MED ORDER — LEVETIRACETAM 500 MG PO TABS: 500.0000 mg | ORAL_TABLET | Freq: Two times a day (BID) | ORAL | 0 refills | Status: AC

## 2016-04-24 MED ORDER — CARVEDILOL 25 MG PO TABS: 25.0000 mg | ORAL_TABLET | Freq: Two times a day (BID) | ORAL | 0 refills | Status: AC

## 2016-04-24 MED ORDER — AMOXICILLIN-POT CLAVULANATE 500-125 MG PO TABS: 1.0000 | ORAL_TABLET | Freq: Two times a day (BID) | ORAL | 0 refills | Status: AC

## 2016-04-24 NOTE — Discharge Summary (Signed)
Physician Discharge Summary  Teal Bontrager ZOX:[id number] DOB: June 29, 1930  PCP: Patria Mane, MD  Admit date: 04/20/2016 Discharge date: 04/24/2016  Admitted From: Home Disposition:  Home  Recommendations for Outpatient Follow-up:  1. Dr. Patria Mane, PCP in one week with repeat labs (CBC & BMP).  2. Dr. Peter Swaziland, Cardiology in 2 weeks.  Home Health: None Equipment/Devices: None    Discharge Condition: Improved and stable.  CODE STATUS: Full  Diet recommendation: Dysphagia 1 diet and nectar thickened liquids as per speech therapy recommendations.  Discharge Diagnoses:  Active Problems:   Chest pain   Shortness of breath   Essential hypertension   Dementia with behavioral disturbance   Seizures (HCC)   BPV (benign positional vertigo)   Hyperglycemia   Volvulus (HCC)   Abdominal distension   Sigmoid volvulus (HCC)   Atypical atrial flutter (HCC)   Brief/Interim Summary: 82 year old male with PMH of advanced dementia, HLD, HTN, CVA, A. fib presented to [facility] ED on 04/20/16 with complaints of dyspnea, sweating, chest and abdominal pain. Patient unable to provide history secondary to advanced dementia and no family were available at that time. He lived at an assisted living facility until approximately a week prior to admission when he was brought home. In the ED, had a single episode of bloody emesis although it was not clear if this was emesis or hemoptysis. Also noted to have atrial flutter, acute kidney injury.   Assessment & Plan:   Chest pain/dyspnea - Resolved without recurrence. Patient has advanced dementia and his history is unreliable. - Troponin 5: Negative.  Abdominal pain/sigmoid volvulus - [facility] was consulted and patient underwent emergent flexible sigmoidoscopy with decompression on 10/3. General surgery consultation appreciated: Not a good surgical candidate but consideration if volvulus recurs. General surgery signed off on 04/23/16. Patient  tolerating diet and having BM.  Hematemesis versus ? hemoptysis  - Single episode in ED on 10/3. No recurrence.  [facility] follow-up appreciated. Stopped IV PPI infusion and continue Pepcid twice a day - As per discussion with [facility]/Dr. Lavon Paganini: Not even sure if he truly had hematemesis and even if he did have, may have been small related to his sigmoid volvulus & was OK to start [facility] with NOAC without need for any further workup.  Possible healthcare associated pneumonia versus aspiration pneumonia - Placed on vancomycin and Maxipime. Improved. Blood cultures 2 negative to date. Transitioned to oral Augmentin. Complete total 7 days of treatment. Recommend follow-up chest x-ray in 4-6 weeks to ensure resolution of pneumonia findings.  Atrial fibrillation and atypical Atrial flutter/status post previous pacemaker for bradycardia - Cardiology consultation and follow-up appreciated. Duration unknown. Cardizem was changed to carvedilol for better rate control. - [place] Vasc score of 5.  - Cardiology interrogated PM -no significant issues - Discussed with Dr. Swaziland who recommends NOAC/Xarelto and DC antiplatelets. As discussed with  GI, okay to start anticoagulation. Started Xarelto. - Patient's spouse wishes for patient to follow-up with Dr. Swaziland. He was agreeable.  Essential hypertension - Uncontrolled. Currently on amlodipine, lisinopril and Cardizem. Cardiology stopped Cardizem and started carvedilol on 10/4 for rate control. Controlled.  Acute on possible stage III chronic kidney disease - Baseline creatinine is not known. Presented with creatinine of 1.5 which is improved to 1.3. Baseline creatinine may be at 1.3.  Advanced dementia - Mental status at baseline.  Seizure disorder - Keppra  Hyperglycemia without history of DM  History of CVA - See discussion above regarding anticoagulation  Dysphagia -  Seen with speech therapy in room. Modified diet per  speech therapy.  Anemia - Stable  Hypomagnesemia - Replaced   Consultants:   [facility]  Gen. Surgery  Cardiology  Procedures:   Flexible sigmoidoscopy 04/21/16: Impression: - Volvulus. Successful complete decompression  achieved. - No specimens collected.  Recommendation: - Use Benefiber two teaspoons PO TID. - Dulcolax 1-3 tabs or caps PO daily to have 1-2  soft BM per day. - Advance diet as tolerated. - Continue present medications. -Out of bed to chair and ambulate as tolerated - Consult surgery for possible sigmoid pexy to  prevent recurrence -Will sign off, available for any questions   Discharge Instructions  Discharge Instructions    Call MD for:    Complete by:  As directed    Vomiting coffee colored or black material or black stools or blood in stools.   Call MD for:  difficulty breathing, headache or visual disturbances    Complete by:  As directed    Call MD for:  extreme fatigue    Complete by:  As directed    Call MD for:  persistant dizziness or light-headedness    Complete by:  As directed    Call MD for:  persistant nausea and vomiting    Complete by:  As directed    Call MD for:  severe uncontrolled pain    Complete by:  As directed    Call MD for:  temperature >100.4    Complete by:  As directed    Diet - low sodium heart healthy    Complete by:  As directed    Discharge instructions    Complete by:  As directed    DIET:  Diet recommendations: Dysphagia 1 (puree);Nectar-thick liquid Liquids provided via: Cup;No straw Medication Administration: Crushed with puree Supervision: Patient able to self feed;Full supervision/cueing for compensatory  strategies Compensations: Minimize environmental distractions;Slow rate;Small sips/bites Postural Changes and/or Swallow Maneuvers: Seated upright 90 degrees;Upright 30-60 min after meal   Increase activity slowly    Complete by:  As directed        Medication List    STOP taking these medications   aspirin EC 81 MG tablet   CARTIA XT 120 MG 24 hr capsule Generic drug:  diltiazem   clopidogrel 75 MG tablet Commonly known as:  PLAVIX   COMBIGAN 0.2-0.5 % ophthalmic solution Generic drug:  brimonidine-timolol   LUMIGAN 0.01 % Soln Generic drug:  bimatoprost     TAKE these medications   amLODipine 10 MG tablet Commonly known as:  NORVASC Take 10 mg by mouth daily.   amoxicillin-clavulanate 500-125 MG tablet Commonly known as:  AUGMENTIN Take 1 tablet (500 mg total) by mouth 2 (two) times daily.   atorvastatin 80 MG tablet Commonly known as:  LIPITOR Take 80 mg by mouth daily.   carvedilol 25 MG tablet Commonly known as:  COREG Take 1 tablet (25 mg total) by mouth 2 (two) times daily with a meal.   [employer] UNDERGARMENT X-ABSORB BELT Misc Frequency:   Dosage:0     Instructions:  Note:Use as directed for urinary incontinence.   donepezil 10 MG tablet Commonly known as:  ARICEPT Take 10 mg by mouth at bedtime.   dorzolamide-timolol 22.3-6.8 MG/ML ophthalmic solution Commonly known as:  COSOPT Place 1 drop into both eyes 2 (two) times daily.   haloperidol 5 MG tablet Commonly known as:  HALDOL 1/2 to 1 at bedtime as needed   latanoprost 0.005 % ophthalmic solution Commonly  known as:  XALATAN Place 1 drop into both eyes at bedtime.   levETIRAcetam 500 MG tablet Commonly known as:  KEPPRA Take 1 tablet (500 mg total) by mouth 2 (two) times daily. What changed:  when to take this   lisinopril 40 MG tablet Commonly known as:  PRINIVIL,ZESTRIL Take 40 mg by mouth daily.   loratadine 10 MG tablet Commonly known as:  CLARITIN Take 10 mg by mouth daily.    Magnesium Oxide 250 MG Tabs TAKE 1 TABLET BY MOUTH DAILY AS DIRECTED   meclizine 12.5 MG tablet Commonly known as:  ANTIVERT Frequency:   Dosage:0   MG  Instructions:  Note:TAKE 1 TABLET BY MOUTH THREE TIMES DAILY AS NEEDED FOR VERTIGO   MULTI-VITAMINS Tabs Take 1 tablet by mouth daily.   Omega-3 1000 MG Caps Take 2 capsules by mouth daily.   potassium chloride SA 20 MEQ tablet Commonly known as:  K-DUR,KLOR-CON TAKE 2 TABLETS BY MOUTH TWICE DAILY WITH MEALS   ranitidine 150 MG tablet Commonly known as:  ZANTAC Take 150 mg by mouth 2 (two) times daily. What changed:  Another medication with the same name was removed. Continue taking this medication, and follow the directions you see here.   Rivaroxaban 15 MG Tabs tablet Commonly known as:  XARELTO Take 1 tablet (15 mg total) by mouth daily with supper.   THIAMINE HCL PO Take 1 tablet by mouth daily. Take as prescribed by Ophthalmologist      Follow-up Information    GASSEMI, MIKE, MD. Schedule an appointment as soon as possible for a visit in 1 week(s).   Specialty:  Internal Medicine Why:  To be seen with repeat labs (CBC & BMP) Contact information: [address] [facility] [address] Internal Med--[place] [address] [phone]        Peter Swaziland, MD. Schedule an appointment as soon as possible for a visit in 2 week(s).   Specialty:  Cardiology Contact information: [address] [place] [address] [phone]          No Known Allergies     Procedures/Studies: Ct Head Wo Contrast  Result Date: 04/20/2016 CLINICAL DATA:  Altered mental status. Dementia. Chest pain, cough, and shortness of breath. EXAM: CT HEAD WITHOUT CONTRAST TECHNIQUE: Contiguous axial images were obtained from the base of the skull through the vertex without intravenous contrast. COMPARISON:  None. FINDINGS: Brain: Prominent diffuse cerebral atrophy. Ventricular dilatation consistent with central  atrophy. Low-attenuation changes in the deep white matter consistent small vessel ischemia. No mass effect or midline shift. No abnormal extra-axial fluid collections. Gray-white matter junctions are distinct. Basal cisterns are not effaced. No evidence of acute intracranial hemorrhage. Vascular: Atherosclerotic vascular calcifications are present. Skull: Normal. Negative for fracture or focal lesion. Sinuses/Orbits: There is an air-fluid level in the right maxillary antrum. This may indicate sinusitis. Paranasal sinuses and mastoid air cells are otherwise clear. Other: None. IMPRESSION: No acute intracranial abnormalities. Prominent cerebral atrophy and small vessel ischemic changes. Air-fluid level in the right maxillary antrum may indicate sinus infection. Electronically Signed   By: Burman Nieves M.D.   On: 04/20/2016 23:06   Ct Angio Chest Pe W Or Wo Contrast  Result Date: 04/21/2016 CLINICAL DATA:  Shortness of breath, chest pain and abdominal pain. EXAM: CT ANGIOGRAPHY CHEST CT ABDOMEN AND PELVIS WITH CONTRAST TECHNIQUE: Multidetector CT imaging of the chest was performed using the standard protocol during bolus administration of intravenous contrast. Multiplanar CT image reconstructions  and MIPs were obtained to evaluate the vascular anatomy. Multidetector CT imaging of the abdomen and pelvis was performed using the standard protocol during bolus administration of intravenous contrast. CONTRAST:  80 mL Isovue 370 IV COMPARISON:  Prior CT of the abdomen and pelvis at [facility] on 11/02/2012 FINDINGS: CTA CHEST FINDINGS Cardiovascular: Pulmonary arterial opacification is somewhat limited in the peripheral lungs. No pulmonary embolism is identified. The thoracic aorta demonstrates calcified plaque without aneurysmal disease. The heart size is normal. No pericardial fluid identified. Probable calcified plaque in the distribution of the left circumflex coronary artery and also potentially the  right coronary artery. Mediastinum/Nodes: No evidence of mediastinal, hilar or axillary lymphadenopathy. No mediastinal masses or fluid collections. The thyroid gland appears unremarkable. Lungs/Pleura: Patchy alveolar airspace disease is noted in the right upper lobe, right middle lobe, right lower lobe and left lower lobe. The most significant involvement is in the right lower lobe. Findings are consistent with pneumonia and may reflect aspiration. No associated pulmonary edema or pleural effusions. No pneumothorax identified. There is a component of bronchial obstruction, likely reflecting mucous plug formation in the medial and posterior right lower lobe. Musculoskeletal: Spondylosis of the thoracic spine. No bony lesions or fractures identified. Review of the MIP images confirms the above findings. CT ABDOMEN and PELVIS FINDINGS Hepatobiliary: Stable hepatic cysts. The largest remains at the dome of the liver measures approximately 2.4 cm. No evidence of hepatic masses or biliary obstruction. The gallbladder is unremarkable. Pancreas: Unremarkable. No pancreatic ductal dilatation or surrounding inflammatory changes. Spleen: Normal in size without focal abnormality. Adrenals/Urinary Tract: Adrenal glands are unremarkable. Kidneys are normal, without renal calculi, focal lesion, or hydronephrosis. Bladder is unremarkable and decompressed. Stomach/Bowel: There is a small hiatal hernia. The stomach contains a moderate amount of fluid. There is massive gaseous dilatation of much of the colon with maximal colonic diameter of approximately 11.8 cm. As the colon is followed by CT, there is a transition at the level of the sigmoid colon with decompression of the distal sigmoid and rectum. There is a swirling appearance of the proximal sigmoid and adjacent vessels within the mesentery and findings are suspicious for a sigmoid volvulus. No associated free air or pneumatosis identified. The small bowel is decompressed. No  abscess is identified. Vascular/Lymphatic: No enlarged lymph nodes are seen. The abdominal aorta is calcified and without evidence of aneurysm. Reproductive: Unremarkable. Other: Small left inguinal hernia contains a short segment of nondilated small bowel. There is no evidence to suggest incarceration. Musculoskeletal: Bony structures show spondylosis of the lumbar spine. Review of the MIP images confirms the above findings. IMPRESSION: 1. Evidence of bilateral pneumonia, right greater than left. Findings may reflect aspiration. 2. Coronary and aortic atherosclerosis. No evidence of aortic aneurysm. 3. Findings by CT concerning for sigmoid volvulus with significant gaseous distention of the colon proximal to a level of relative beaking of the sigmoid colon and decompression of the sigmoid and rectum distal to the transition point. Recommend surgical and gastroenterology consultation. 4. These results will be called to the ordering clinician or representative by the Radiologist Assistant, and communication documented in the PACS or zVision Dashboard. Electronically Signed   By: Irish Lack M.D.   On: 04/21/2016 13:19   Ct Abdomen Pelvis W Contrast  Result Date: 04/21/2016 CLINICAL DATA:  Shortness of breath, chest pain and abdominal pain. EXAM: CT ANGIOGRAPHY CHEST CT ABDOMEN AND PELVIS WITH CONTRAST TECHNIQUE: Multidetector CT imaging of the chest was performed using the standard protocol  during bolus administration of intravenous contrast. Multiplanar CT image reconstructions and MIPs were obtained to evaluate the vascular anatomy. Multidetector CT imaging of the abdomen and pelvis was performed using the standard protocol during bolus administration of intravenous contrast. CONTRAST:  80 mL Isovue 370 IV COMPARISON:  Prior CT of the abdomen and pelvis at [facility] on 11/02/2012 FINDINGS: CTA CHEST FINDINGS Cardiovascular: Pulmonary arterial opacification is somewhat limited in the peripheral  lungs. No pulmonary embolism is identified. The thoracic aorta demonstrates calcified plaque without aneurysmal disease. The heart size is normal. No pericardial fluid identified. Probable calcified plaque in the distribution of the left circumflex coronary artery and also potentially the right coronary artery. Mediastinum/Nodes: No evidence of mediastinal, hilar or axillary lymphadenopathy. No mediastinal masses or fluid collections. The thyroid gland appears unremarkable. Lungs/Pleura: Patchy alveolar airspace disease is noted in the right upper lobe, right middle lobe, right lower lobe and left lower lobe. The most significant involvement is in the right lower lobe. Findings are consistent with pneumonia and may reflect aspiration. No associated pulmonary edema or pleural effusions. No pneumothorax identified. There is a component of bronchial obstruction, likely reflecting mucous plug formation in the medial and posterior right lower lobe. Musculoskeletal: Spondylosis of the thoracic spine. No bony lesions or fractures identified. Review of the MIP images confirms the above findings. CT ABDOMEN and PELVIS FINDINGS Hepatobiliary: Stable hepatic cysts. The largest remains at the dome of the liver measures approximately 2.4 cm. No evidence of hepatic masses or biliary obstruction. The gallbladder is unremarkable. Pancreas: Unremarkable. No pancreatic ductal dilatation or surrounding inflammatory changes. Spleen: Normal in size without focal abnormality. Adrenals/Urinary Tract: Adrenal glands are unremarkable. Kidneys are normal, without renal calculi, focal lesion, or hydronephrosis. Bladder is unremarkable and decompressed. Stomach/Bowel: There is a small hiatal hernia. The stomach contains a moderate amount of fluid. There is massive gaseous dilatation of much of the colon with maximal colonic diameter of approximately 11.8 cm. As the colon is followed by CT, there is a transition at the level of the sigmoid  colon with decompression of the distal sigmoid and rectum. There is a swirling appearance of the proximal sigmoid and adjacent vessels within the mesentery and findings are suspicious for a sigmoid volvulus. No associated free air or pneumatosis identified. The small bowel is decompressed. No abscess is identified. Vascular/Lymphatic: No enlarged lymph nodes are seen. The abdominal aorta is calcified and without evidence of aneurysm. Reproductive: Unremarkable. Other: Small left inguinal hernia contains a short segment of nondilated small bowel. There is no evidence to suggest incarceration. Musculoskeletal: Bony structures show spondylosis of the lumbar spine. Review of the MIP images confirms the above findings. IMPRESSION: 1. Evidence of bilateral pneumonia, right greater than left. Findings may reflect aspiration. 2. Coronary and aortic atherosclerosis. No evidence of aortic aneurysm. 3. Findings by CT concerning for sigmoid volvulus with significant gaseous distention of the colon proximal to a level of relative beaking of the sigmoid colon and decompression of the sigmoid and rectum distal to the transition point. Recommend surgical and gastroenterology consultation. 4. These results will be called to the ordering clinician or representative by the Radiologist Assistant, and communication documented in the PACS or zVision Dashboard. Electronically Signed   By: Irish Lack M.D.   On: 04/21/2016 13:19   Dg Chest Portable 1 View  Result Date: 04/20/2016 CLINICAL DATA:  Dyspnea since this evening. History of dementia, infarct, hypertension and pacemaker all. EXAM: PORTABLE CHEST 1 VIEW COMPARISON:  12/25/2015. FINDINGS:  Cardiomegaly with tortuous ectatic appearing thoracic aorta. Right ventricular pacing wire is noted with left-sided pacemaker apparatus. Skin fold artifact along the periphery of the right hemithorax. No pneumonic consolidation, CHF, pneumothorax nor effusion. Moderate colonic distention  below the diaphragm up to 8 cm possibly representing pseudo-obstruction. No suspicious osseous abnormalities. IMPRESSION: No acute cardiopulmonary disease. Cardiomegaly. Skin fold artifact along the periphery of the right thorax. Electronically Signed   By: Tollie Eth M.D.   On: 04/20/2016 22:07   Dg Abd Portable 1v  Result Date: 04/22/2016 CLINICAL DATA:  Abdominal distention. Volvulus status post endoscopic decompression yesterday. EXAM: PORTABLE ABDOMEN - 1 VIEW COMPARISON:  CT 04/21/2016 FINDINGS: Diffuse gaseous distention of the colon, decreased slightly since prior CT. No free air organomegaly. Lung bases are clear. No acute bony abnormality. IMPRESSION: Slight improvement in diffuse gaseous distention of the colon since prior study. Electronically Signed   By: Charlett Nose M.D.   On: 04/22/2016 12:51      Subjective: Pleasantly confused. Denies complaints. As per RN, no acute issues.   Discharge Exam:  Vitals:   04/23/16 1300 04/23/16 1958 04/24/16 0340 04/24/16 1339  BP: 126/75 120/63 (!) 163/86 133/71  Pulse: 60 62 82 65  Resp:  20 18 18   Temp: 97.7 F (36.5 C) 98.7 F (37.1 C) 98.4 F (36.9 C) 98.1 F (36.7 C)  TempSrc: Oral Oral Oral Oral  SpO2: 97% 95% 100% 100%  Weight:   69.6 kg (153 lb 6.4 oz)   Height:        General exam: Frail elderly male, chronically ill-looking, lying comfortably propped up in bed.  Respiratory system: Clear to auscultation. Respiratory effort normal. Cardiovascular system: S1 & S2 heard, RRR. No JVD, murmurs, rubs, gallops or clicks. No pedal edema. telemetry: A. fib with controlled ventricular rate/intermittent pacemaker spikes.  Gastrointestinal system: Abdomen is nondistended, soft and nontender. No organomegaly or masses felt. Normal bowel sounds heard. Central nervous system: Alert and oriented only to self . No focal neurological deficits. Extremities: Symmetric 5 x 5 power. Resting coarse tremors of left upper extremity with cogwheel  rigidity  Skin: No rashes, lesions or ulcers Psychiatry: Judgement and insight impaired. Mood & affect appropriate.     The results of significant diagnostics from this hospitalization (including imaging, microbiology, ancillary and laboratory) are listed below for reference.     Microbiology: Recent Results (from the past 240 hour(s))  Culture, blood (routine x 2) Call MD if unable to obtain prior to antibiotics being given     Status: None (Preliminary result)   Collection Time: 04/21/16  4:45 PM  Result Value Ref Range Status   Specimen Description BLOOD RIGHT ANTECUBITAL  Final   Special Requests BOTTLES DRAWN AEROBIC ONLY 10CC  Final   Culture NO GROWTH 3 DAYS  Final   Report Status PENDING  Incomplete  Culture, blood (routine x 2) Call MD if unable to obtain prior to antibiotics being given     Status: None (Preliminary result)   Collection Time: 04/21/16  4:55 PM  Result Value Ref Range Status   Specimen Description BLOOD RIGHT ARM  Final   Special Requests BOTTLES DRAWN AEROBIC ONLY 9CC  Final   Culture NO GROWTH 3 DAYS  Final   Report Status PENDING  Incomplete     Labs: BNP (last 3 results)  Recent Labs  04/21/16 1420  BNP 240.9*   Basic Metabolic Panel:  Recent Labs Lab 04/20/16 2110 04/21/16 1420 04/22/16 0039 04/23/16 0338  NA  137  --  141 141  K 3.9  --  4.4 4.1  CL 108  --  109 113*  CO2 19*  --  21* 23  GLUCOSE 210*  --  100* 109*  BUN 20  --  23* 33*  CREATININE 1.52*  --  1.34* 1.38*  CALCIUM 10.5*  --  9.7 9.5  MG  --  1.5*  --  1.6*  PHOS  --  2.8  --   --    Liver Function Tests:  Recent Labs Lab 04/20/16 2110 04/22/16 0039  AST 27 29  ALT 25 22  ALKPHOS 77 52  BILITOT 0.6 1.1  PROT 8.9* 6.9  ALBUMIN 4.0 2.8*   No results for input(s): LIPASE, AMYLASE in the last 168 hours. No results for input(s): AMMONIA in the last 168 hours. CBC:  Recent Labs Lab 04/20/16 2110  04/21/16 0910 04/21/16 1420 04/22/16 0039 04/23/16 0338  04/24/16 0152  WBC 7.0  < > 6.6 9.3 14.7* 13.0* 12.4*  NEUTROABS 5.4  --   --   --   --   --   --   HGB 14.0  < > 15.8 15.1 13.1 11.6* 11.7*  HCT 41.7  < > 47.3 45.5 39.9 36.1* 36.5*  MCV 91.9  < > 91.8 92.5 92.6 93.0 93.4  PLT 212  < > 198 184 177 143* 156  < > = values in this interval not displayed. Cardiac Enzymes:  Recent Labs Lab 04/20/16 2110 04/21/16 0433 04/21/16 0910 04/21/16 1420  TROPONINI <0.03 <0.03 <0.03 <0.03  <0.03   BNP: Invalid input(s): POCBNP CBG:  Recent Labs Lab 04/22/16 2111 04/23/16 0631 04/23/16 1131 04/24/16 0628 04/24/16 1124  GLUCAP 117* 108* 103* 94 149*   Urinalysis    Component Value Date/Time   COLORURINE YELLOW 04/20/2016 2302   APPEARANCEUR CLOUDY (A) 04/20/2016 2302   LABSPEC 1.020 04/20/2016 2302   PHURINE 5.5 04/20/2016 2302   GLUCOSEU NEGATIVE 04/20/2016 2302   HGBUR SMALL (A) 04/20/2016 2302   BILIRUBINUR SMALL (A) 04/20/2016 2302   KETONESUR 15 (A) 04/20/2016 2302   PROTEINUR >300 (A) 04/20/2016 2302   NITRITE NEGATIVE 04/20/2016 2302   LEUKOCYTESUR NEGATIVE 04/20/2016 2302     Time coordinating discharge: Over 30 minutes  SIGNED:  Marcellus ScottHONGALGI,ANAND, MD, FACP, FHM. [facility] Pager [phone]  If 7PM-7AM, please contact night-coverage [email or web address] Password [email or web address] 04/24/2016, 5:58 PM

## 2016-04-24 NOTE — Care Management Important Message (Signed)
Important Message  Patient Details  Name: Edwin Wilson MRN: [id number] Date of Birth: [birth year]   Medicare Important Message Given:  Yes    Edwin Wilson 04/24/2016, 1:04 PM

## 2016-04-24 NOTE — Care Management Note (Signed)
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   Expected Discharge Date:                  Expected Discharge Plan:  Home/Self Care  In-House Referral:     Discharge planning Services  CM Consult, Medication Assistance  Post Acute Care Choice:    Choice offered to:     DME Arranged:    DME Agency:     HH Arranged:    HH Agency:     Status of Service:  Completed, signed off  If discussed at [employer] Length of Stay Meetings, dates discussed:    Additional Comments:  04/24/16- 1045- Sheronda Parran RN, CM- spoke with pt's wife via TC- regarding Xarelto and coverage cost- per conversation wife- states copay a bit high but is willing to try for a few months to see if they can handle the cost- if not she will f/u with pt's doctor about alternative medication. Informed her that 30 day free card was left at bedside to use on discharge- per wife she will be here this afternoon sometime after 2pm to pick pt up for discharge.   Darrold SpanWebster, Atthew Coutant Hall, RN 04/24/2016, 10:48 AM

## 2016-04-24 NOTE — Progress Notes (Signed)
Order received to discharge.  IV removed with catheter intact.  Discharge education given to Pt's spouse.  Handout given on Xarelto and antibiotic.  Pt's spouse indicates understanding of all education provided.  Pt denies abdominal pain at this time.  Pt stable to discharge.

## 2016-04-26 LAB — CULTURE, BLOOD (ROUTINE X 2)
CULTURE: NO GROWTH
Culture: NO GROWTH

## 2016-05-20 DEATH — deceased

## 2018-02-01 IMAGING — CT CT HEAD W/O CM
3 series · 15 of 47 positions shown, 18 images · non-contrast
Comparison: None.

CLINICAL DATA: Altered mental status. Dementia. Chest pain, cough,
and shortness of breath.

EXAM:
CT HEAD WITHOUT CONTRAST
TECHNIQUE: Contiguous axial images were obtained from the base of the skull
through the vertex without intravenous contrast.

[Series 2: head wo · axial · 0.47mm/px · z∈[-167,-32]mm · 9 of 33 slices shown, 12 images]
[im 3/33  brain]
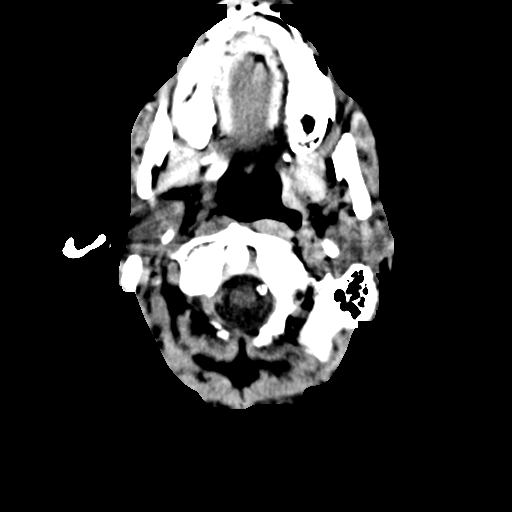
[im 3/33  bone]
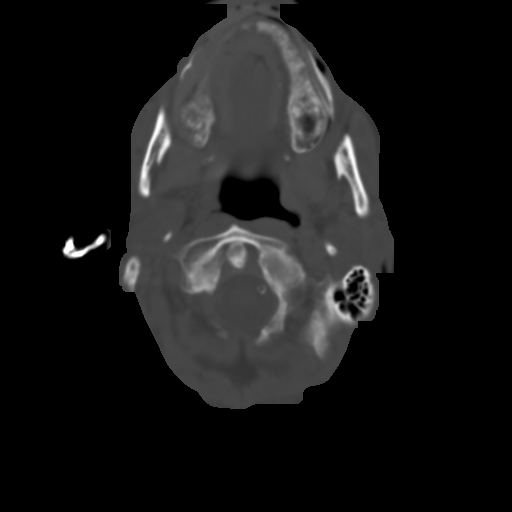
[im 6/33  brain]
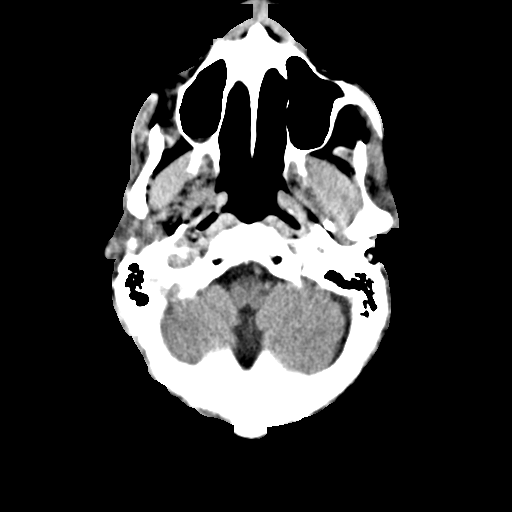
[im 9/33  brain]
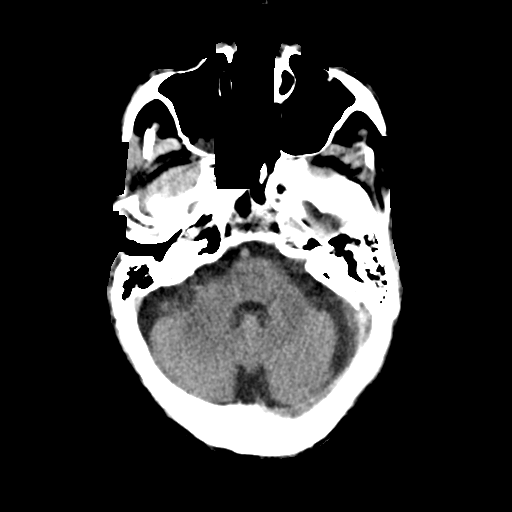
[im 13/33  brain]
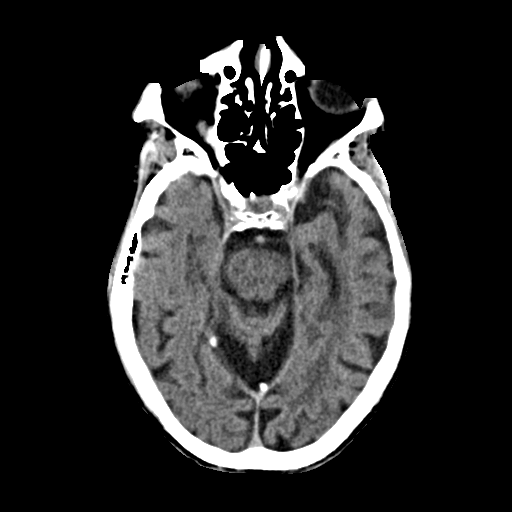
[im 17/33  brain]
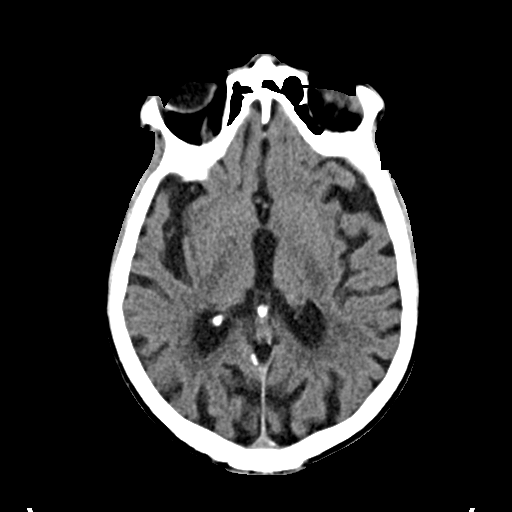
[im 17/33  bone]
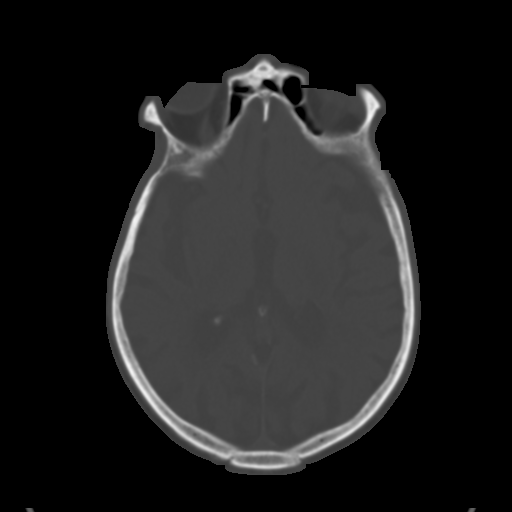
[im 20/33  brain]
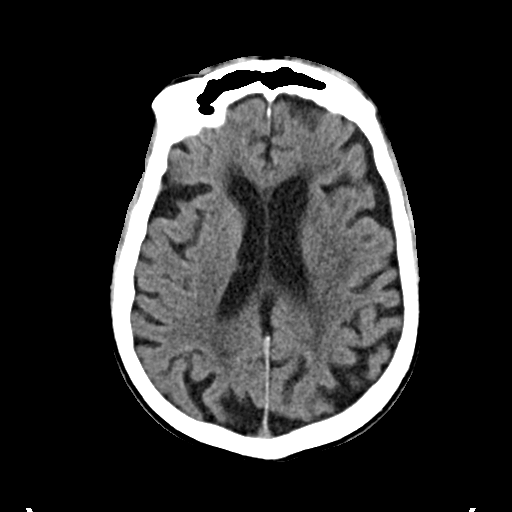
[im 24/33  brain]
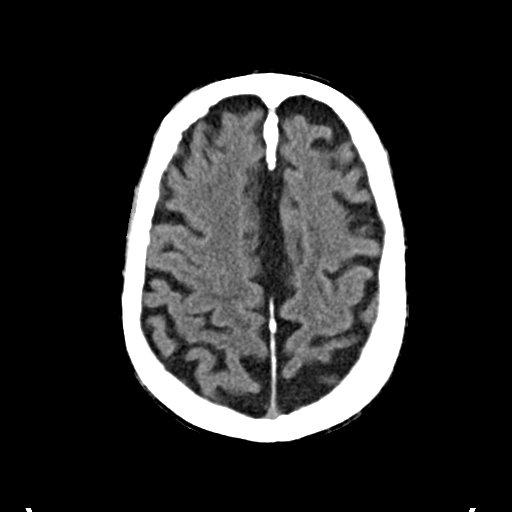
[im 27/33  brain]
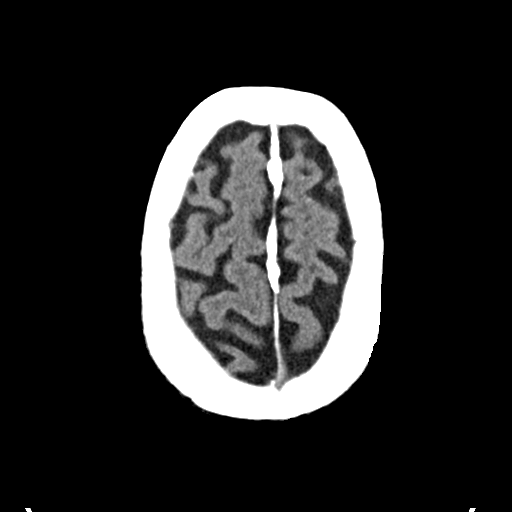
[im 30/33  brain]
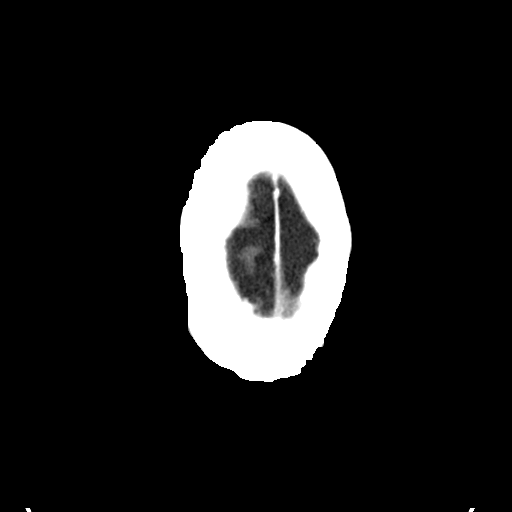
[im 30/33  bone]
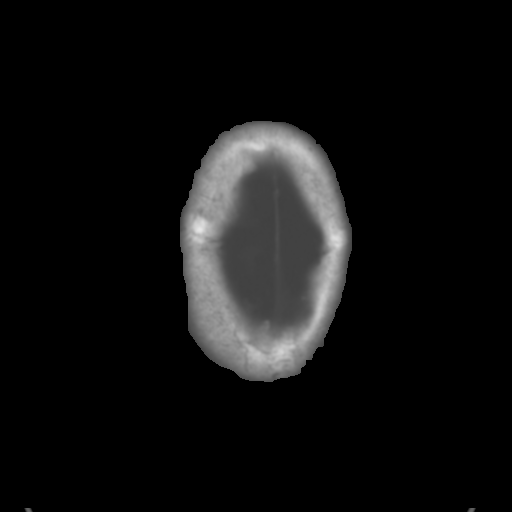

[Series 4: coronal soft · coronal · 0.34mm/px · 3 of 70 slices shown]
[im 24/70  brain]
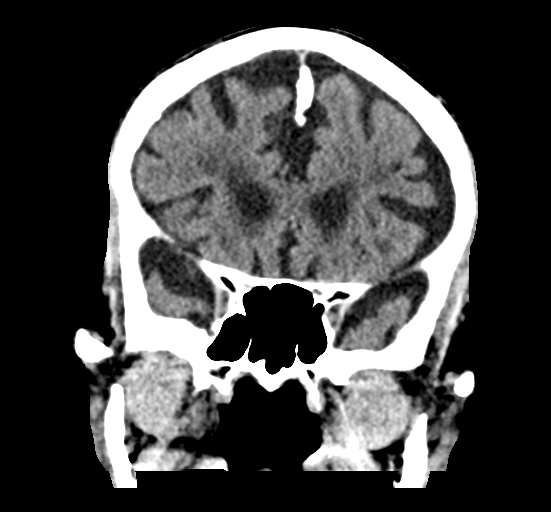
[im 31/70  brain]
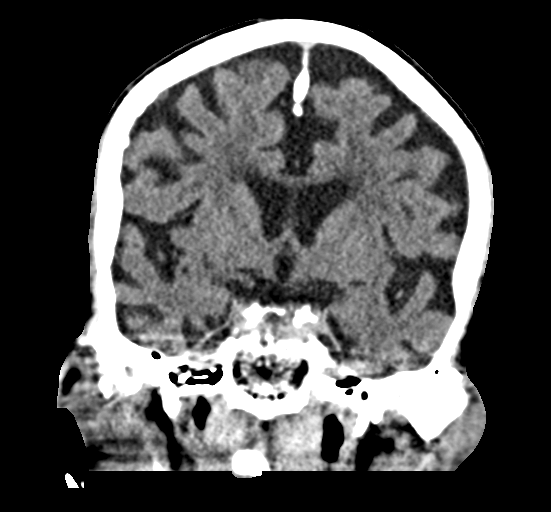
[im 39/70  brain]
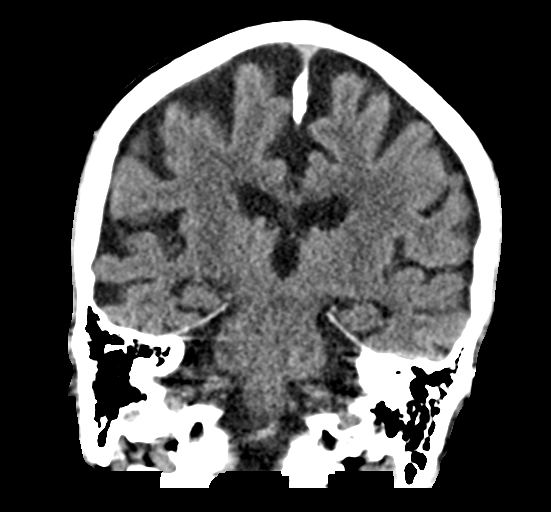

[Series 5: sag soft · sagittal · 0.32mm/px · 3 of 48 slices shown]
[im 16/48  brain]
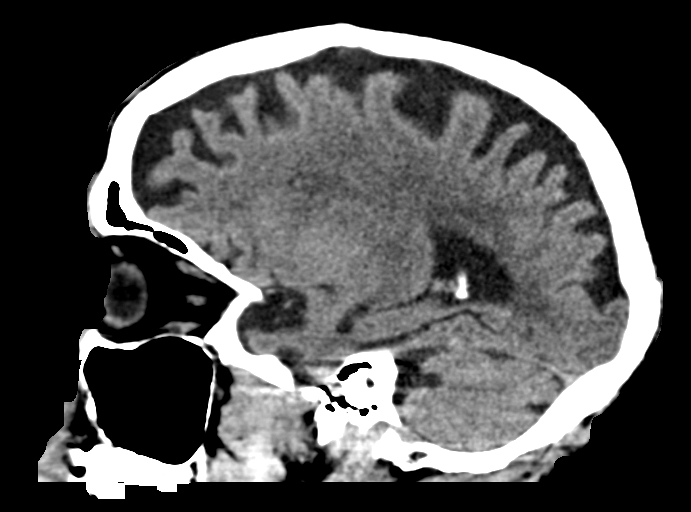
[im 24/48  brain]
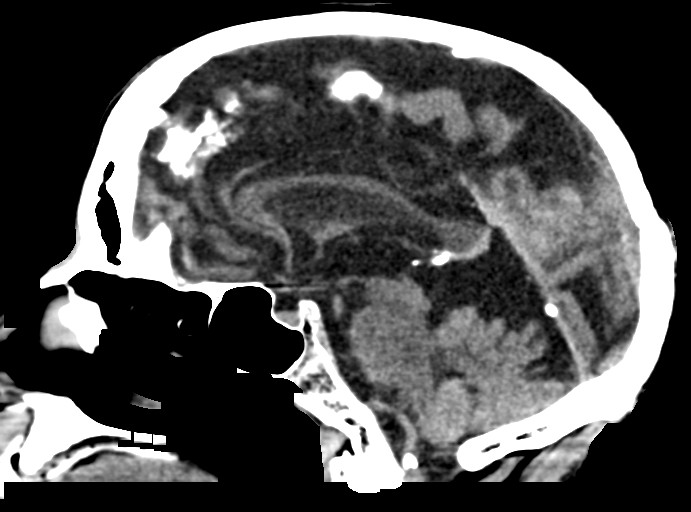
[im 32/48  brain]
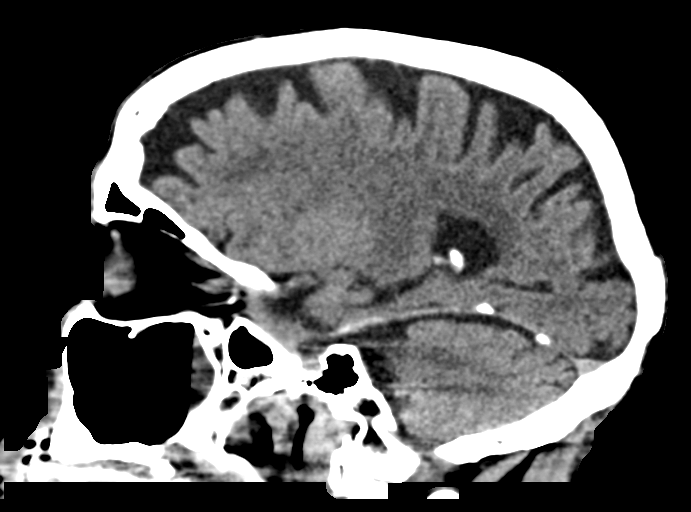

[15 of 47 positions shown; findings below may reference images not displayed]

FINDINGS: Brain: Prominent diffuse cerebral atrophy. Ventricular dilatation
consistent with central atrophy. Low-attenuation changes in the deep
white matter consistent small vessel ischemia. No mass effect or
midline shift. No abnormal extra-axial fluid collections. Gray-white
matter junctions are distinct. Basal cisterns are not effaced. No
evidence of acute intracranial hemorrhage.

Vascular: Atherosclerotic vascular calcifications are present.

Skull: Normal. Negative for fracture or focal lesion.

Sinuses/Orbits: There is an air-fluid level in the right maxillary
antrum. This may indicate sinusitis. Paranasal sinuses and mastoid
air cells are otherwise clear.

Other: None.
IMPRESSION: No acute intracranial abnormalities. Prominent cerebral atrophy and
small vessel ischemic changes. Air-fluid level in the right
maxillary antrum may indicate sinus infection.

## 2018-02-02 IMAGING — CT CT ANGIO CHEST
3 of 11 series · 14 of 37 positions shown · IV contrast (Omni 300)
Comparison: Prior CT of the abdomen and pelvis at [REDACTED] on 11/02/2012

CLINICAL DATA: Shortness of breath, chest pain and abdominal pain.

EXAM:
CT ANGIOGRAPHY CHEST
CT ABDOMEN AND PELVIS WITH CONTRAST
TECHNIQUE: Multidetector CT imaging of the chest was performed using the
standard protocol during bolus administration of intravenous
contrast. Multiplanar CT image reconstructions and MIPs were
obtained to evaluate the vascular anatomy. Multidetector CT imaging
of the abdomen and pelvis was performed using the standard protocol
during bolus administration of intravenous contrast.
CONTRAST:  80 mL Isovue 370 IV

[Series 4: pe 2mm · axial · 0.71mm/px · z∈[+636,+822]mm · 4 of 155 slices shown]
[im 31/155  lung]
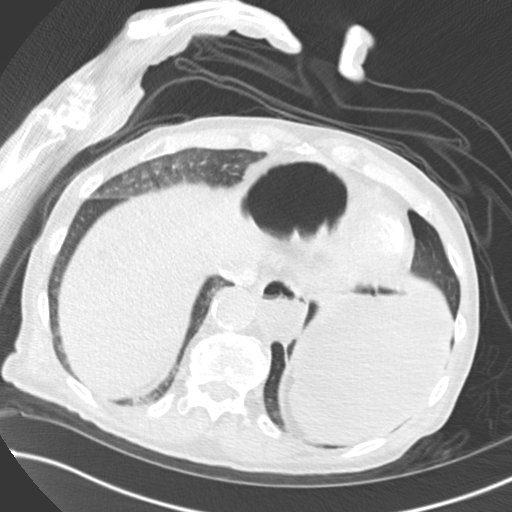
[im 62/155  lung]
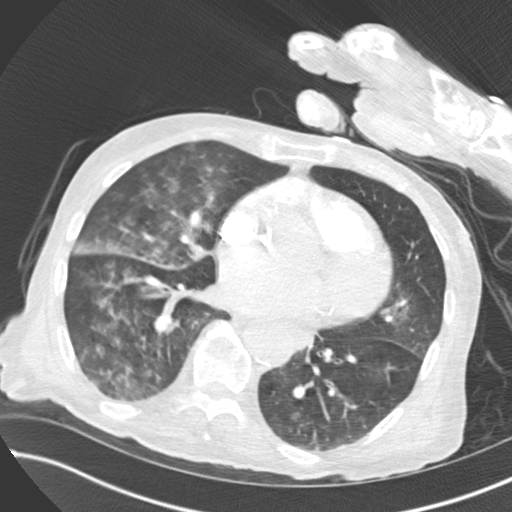
[im 93/155  lung]
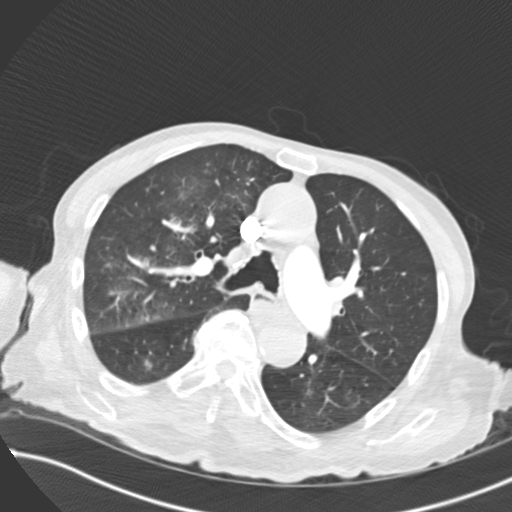
[im 124/155  lung]
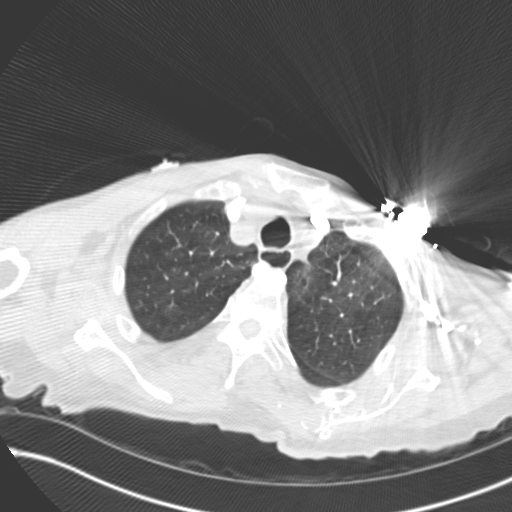

[Series 6: pe thins · axial · 0.71mm/px · z∈[+597,+861]mm · 8 of 310 slices shown]
[im 23/310  lung]
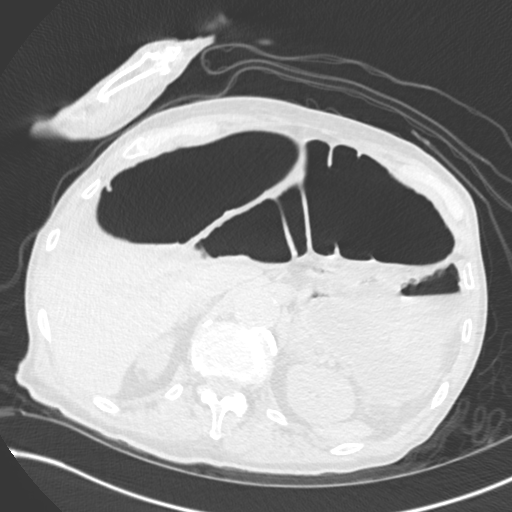
[im 67/310  lung]
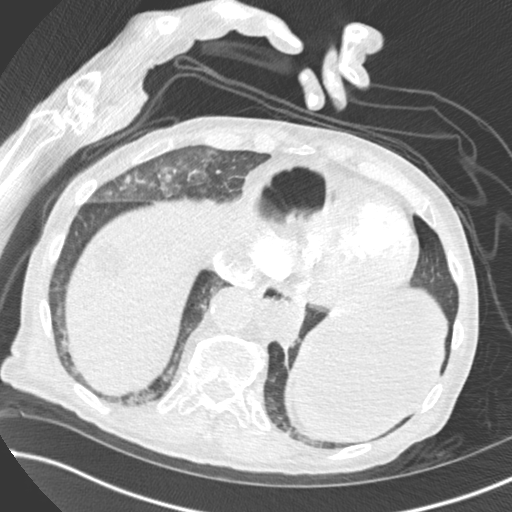
[im 111/310  lung]
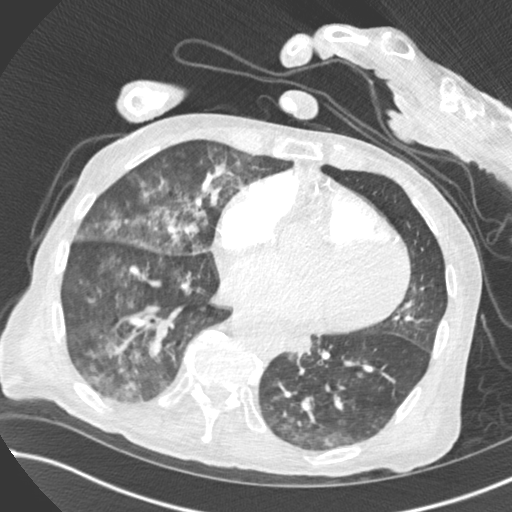
[im 133/310  lung]
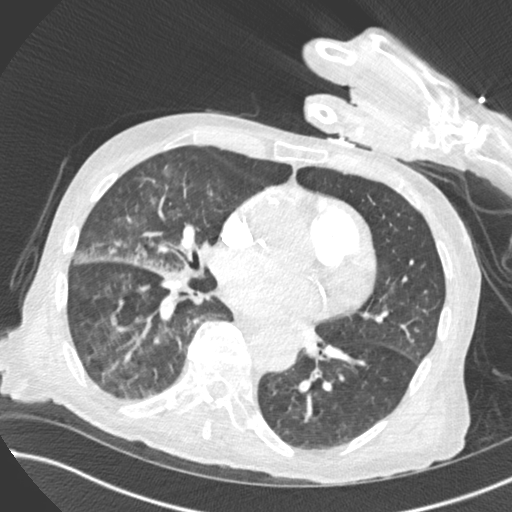
[im 177/310  lung]
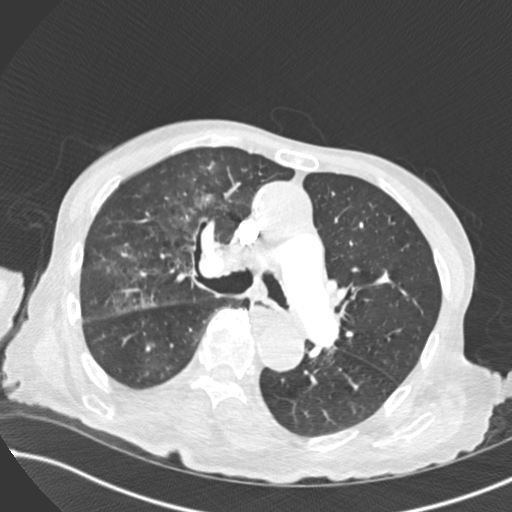
[im 199/310  lung]
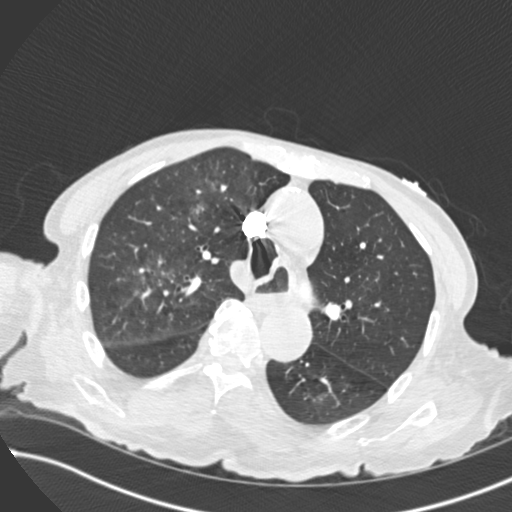
[im 243/310  lung]
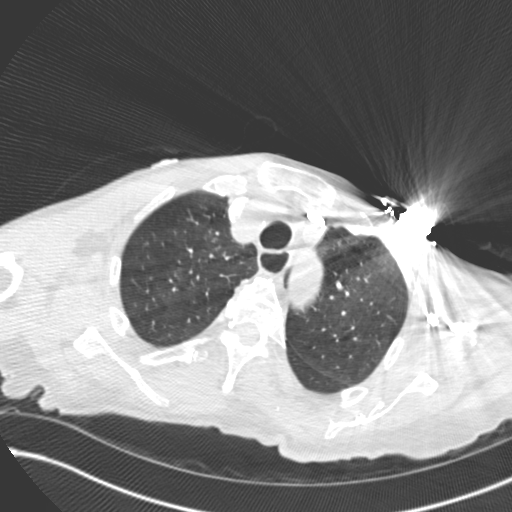
[im 287/310  lung]
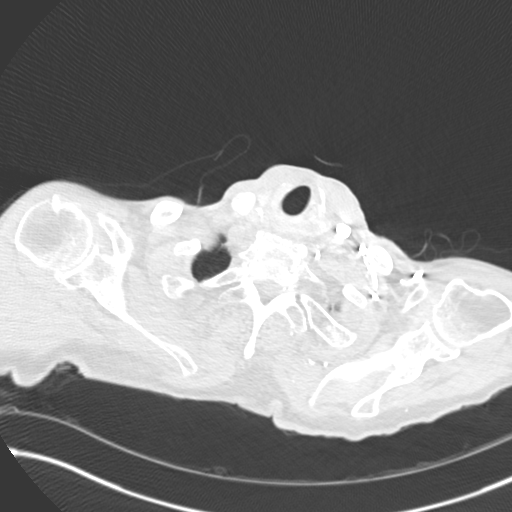

[Series 11: a/p w/ 5mm · axial · 0.78mm/px · z∈[+416,+552]mm · 2 of 83 slices shown]
[im 28/83  lung]
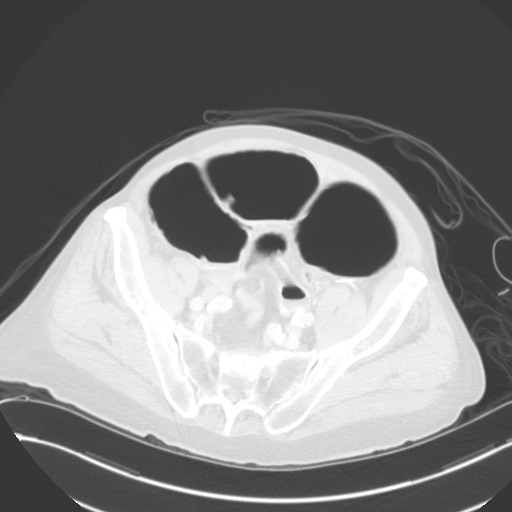
[im 55/83  mediastinal]
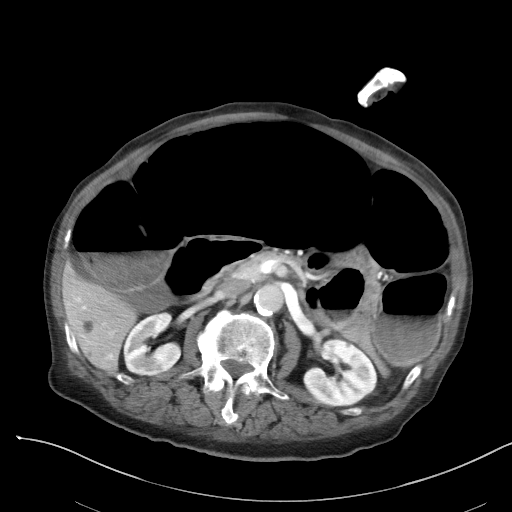

[14 of 37 positions shown; findings below may reference images not displayed]

FINDINGS: CTA CHEST FINDINGS

Cardiovascular: Pulmonary arterial opacification is somewhat limited
in the peripheral lungs. No pulmonary embolism is identified. The
thoracic aorta demonstrates calcified plaque without aneurysmal
disease. The heart size is normal. No pericardial fluid identified.
Probable calcified plaque in the distribution of the left circumflex
coronary artery and also potentially the right coronary artery.

Mediastinum/Nodes: No evidence of mediastinal, hilar or axillary
lymphadenopathy. No mediastinal masses or fluid collections. The
thyroid gland appears unremarkable.

Lungs/Pleura: Patchy alveolar airspace disease is noted in the right
upper lobe, right middle lobe, right lower lobe and left lower lobe.
The most significant involvement is in the right lower lobe.
Findings are consistent with pneumonia and may reflect aspiration.
No associated pulmonary edema or pleural effusions. No pneumothorax
identified. There is a component of bronchial obstruction, likely
reflecting mucous plug formation in the medial and posterior right
lower lobe.

Musculoskeletal: Spondylosis of the thoracic spine. No bony lesions
or fractures identified.

Review of the MIP images confirms the above findings.

CT ABDOMEN and PELVIS FINDINGS

Hepatobiliary: Stable hepatic cysts. The largest remains at the dome
of the liver measures approximately 2.4 cm. No evidence of hepatic
masses or biliary obstruction. The gallbladder is unremarkable.

Pancreas: Unremarkable. No pancreatic ductal dilatation or
surrounding inflammatory changes.

Spleen: Normal in size without focal abnormality.

Adrenals/Urinary Tract: Adrenal glands are unremarkable. Kidneys are
normal, without renal calculi, focal lesion, or hydronephrosis.
Bladder is unremarkable and decompressed.

Stomach/Bowel: There is a small hiatal hernia. The stomach contains
a moderate amount of fluid. There is massive gaseous dilatation of
much of the colon with maximal colonic diameter of approximately
11.8 cm. As the colon is followed by CT, there is a transition at
the level of the sigmoid colon with decompression of the distal
sigmoid and rectum. There is a swirling appearance of the proximal
sigmoid and adjacent vessels within the mesentery and findings are
suspicious for a sigmoid volvulus. No associated free air or
pneumatosis identified. The small bowel is decompressed. No abscess
is identified.

Vascular/Lymphatic: No enlarged lymph nodes are seen. The abdominal
aorta is calcified and without evidence of aneurysm.

Reproductive: Unremarkable.

Other: Small left inguinal hernia contains a short segment of
nondilated small bowel. There is no evidence to suggest
incarceration.

Musculoskeletal: Bony structures show spondylosis of the lumbar
spine.

Review of the MIP images confirms the above findings.
IMPRESSION: 1. Evidence of bilateral pneumonia, right greater than left.
Findings may reflect aspiration.
2. Coronary and aortic atherosclerosis. No evidence of aortic
aneurysm.
3. Findings by CT concerning for sigmoid volvulus with significant
gaseous distention of the colon proximal to a level of relative
beaking of the sigmoid colon and decompression of the sigmoid and
rectum distal to the transition point. Recommend surgical and
gastroenterology consultation.
4. These results will be called to the ordering clinician or
representative by the Radiologist Assistant, and communication
documented in the PACS or zVision Dashboard.
